# Patient Record
Sex: Female | Born: 1955 | Race: White | Hispanic: No | Marital: Married | Smoking: Never smoker
Health system: Southern US, Community
[De-identification: ages and names within clinical notes are randomized; demographics above are authoritative.]

## PROBLEM LIST (undated history)

## (undated) DIAGNOSIS — K219 Gastro-esophageal reflux disease without esophagitis: Secondary | ICD-10-CM

## (undated) DIAGNOSIS — I480 Paroxysmal atrial fibrillation: Secondary | ICD-10-CM

## (undated) DIAGNOSIS — E785 Hyperlipidemia, unspecified: Secondary | ICD-10-CM

## (undated) HISTORY — DX: Hyperlipidemia, unspecified: E78.5

## (undated) HISTORY — DX: Gastro-esophageal reflux disease without esophagitis: K21.9

---
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
## 1994-02-10 HISTORY — PX: DIAGNOSTIC LAPAROSCOPY WITH REMOVAL OF ECTOPIC PREGNANCY: SHX6449

## 1996-02-11 HISTORY — PX: CHOLECYSTECTOMY: SHX55

## 2013-02-10 HISTORY — PX: CARPAL TUNNEL RELEASE: SHX101

## 2014-12-29 LAB — HM COLONOSCOPY

## 2015-03-23 LAB — LIPID PANEL
Cholesterol: 226 — AB (ref 0–200)
HDL: 43 (ref 35–70)
LDL Cholesterol: 162
Triglycerides: 105 (ref 40–160)

## 2015-10-22 LAB — HEPATIC FUNCTION PANEL
ALT: 13 (ref 7–35)
AST: 20 (ref 13–35)
Alkaline Phosphatase: 86 (ref 25–125)
Bilirubin, Total: 0.8

## 2015-10-22 LAB — LIPID PANEL
Cholesterol: 216 — AB (ref 0–200)
HDL: 40 (ref 35–70)
LDL Cholesterol: 154
Triglycerides: 111 (ref 40–160)

## 2015-10-22 LAB — BASIC METABOLIC PANEL
BUN: 14 (ref 4–21)
Creatinine: 1.1 (ref 0.5–1.1)
Potassium: 4 (ref 3.4–5.3)
Sodium: 141 (ref 137–147)

## 2015-12-07 LAB — HM MAMMOGRAPHY

## 2018-09-21 ENCOUNTER — Ambulatory Visit: Payer: Self-pay | Admitting: Physician Assistant

## 2018-09-28 ENCOUNTER — Ambulatory Visit (INDEPENDENT_AMBULATORY_CARE_PROVIDER_SITE_OTHER): Payer: Commercial Managed Care - PPO | Admitting: Physician Assistant

## 2018-09-28 ENCOUNTER — Encounter: Payer: Self-pay | Admitting: Physician Assistant

## 2018-09-28 VITALS — Ht 65.0 in | Wt 175.0 lb

## 2018-09-28 DIAGNOSIS — I480 Paroxysmal atrial fibrillation: Secondary | ICD-10-CM | POA: Diagnosis not present

## 2018-09-28 DIAGNOSIS — R220 Localized swelling, mass and lump, head: Secondary | ICD-10-CM

## 2018-09-28 DIAGNOSIS — L719 Rosacea, unspecified: Secondary | ICD-10-CM | POA: Insufficient documentation

## 2018-09-28 DIAGNOSIS — E785 Hyperlipidemia, unspecified: Secondary | ICD-10-CM | POA: Insufficient documentation

## 2018-09-28 DIAGNOSIS — K219 Gastro-esophageal reflux disease without esophagitis: Secondary | ICD-10-CM | POA: Insufficient documentation

## 2018-09-28 DIAGNOSIS — R0602 Shortness of breath: Secondary | ICD-10-CM

## 2018-09-28 DIAGNOSIS — Z789 Other specified health status: Secondary | ICD-10-CM

## 2018-09-28 DIAGNOSIS — H9193 Unspecified hearing loss, bilateral: Secondary | ICD-10-CM | POA: Diagnosis not present

## 2018-09-28 HISTORY — DX: Paroxysmal atrial fibrillation: I48.0

## 2018-09-28 NOTE — Progress Notes (Signed)
Virtual Visit via Video   I connected with Kaitlin Cortez on 09/28/18 at  8:00 AM EDT by a video enabled telemedicine application and verified that I am speaking with the correct person using two identifiers. Location patient: Home Location provider: [facility], Office Persons participating in the virtual visit: Kaitlin Cortez, Jarold MottoSamantha Salvadore Valvano PA-C  I discussed the limitations of evaluation and management by telemedicine and the availability of in person appointments. The patient expressed understanding and agreed to proceed.  I acted as a Neurosurgeonscribe for Energy East CorporationSamantha Ziva Nunziata, PA-C Kimberly-ClarkDonna Orphanos, LPN  Subjective:   HPI:  Pt is establishing care today. Moved from [place], [place] last month. Husband got a job at [employer]. She works remotely doing Clinical biochemistcomputer programming.  Paroxysmal Atrial Fibrillation and new SOB Dx with A-fib in her mid 40's. Was trialed on several medications before ending up with flecainide and metoprolol. Stopped taking metoprolol about 10 years ago because it was making her HR too low. She also states that she is currently prescribed Flecainide 100 mg BID but she cannot tolerate taking it in the day so she only takes a 100 mg tablet at night. Has never had ablation because her symptoms were relatively well controlled.  Over the past few weeks, she states that she has had some intermittent SOB. Typically occurs with exertion, such as walking up stairs or going for a stroll. Last week when she went for a walk, her heart rate got up to 180's. She isn't sure if the change in weather is affecting her or if her a fib is becoming less controlled. She was seeing an EP in [place] and would like to see one here to discuss these issues. Denies b/l leg swelling, chest pain, fluid weight gain.  She does have hx of HLD. She has tried several statins, and all cause myalgias per her report. She takes daily ASA 325 mg.  Hearing Loss History of this. Currently wears hearing aids and her R  one is currently not functioning. Needs an audiologist.  Swelling of R side of face Has a small pocket of swelling on her R cheek that has been going on for the past few months. In the morning it is at its worst and decreases throughout the day. She denies trauma. She does use a daily antibacterial cream for her rosacea, not sure if this causing this.  ROS: See pertinent positives and negatives per HPI.  Patient Active Problem List   Diagnosis Date Noted  . Paroxysmal atrial fibrillation (HCC) 09/28/2018  . Bilateral hearing loss 09/28/2018  . Statin intolerance 09/28/2018  . Hyperlipidemia   . GERD (gastroesophageal reflux disease)   . Rosacea     Social History   Tobacco Use  . Smoking status: Never Smoker  . Smokeless tobacco: Never Used  Substance Use Topics  . Alcohol use: Yes    Alcohol/week: 1.0 standard drinks    Types: 1 Glasses of wine per week    Current Outpatient Medications:  .  aspirin 325 MG tablet, Take 325 mg by mouth daily., Disp: , Rfl:  .  flecainide (TAMBOCOR) 100 MG tablet, Take 100 mg by mouth at bedtime., Disp: , Rfl:  .  Multiple Vitamin (MULTIVITAMIN) tablet, Take 1 tablet by mouth daily., Disp: , Rfl:  .  pantoprazole (PROTONIX) 40 MG tablet, Take 40 mg by mouth at bedtime. , Disp: , Rfl:   Allergies  Allergen Reactions  . Statins Other (See Comments)    myalgias    Objective:  VITALS: Per patient if applicable, see vitals. GENERAL: Alert, appears well and in no acute distress. HEENT: Atraumatic, conjunctiva clear, no obvious abnormalities on inspection of external nose and ears. NECK: Normal movements of the head and neck. CARDIOPULMONARY: No increased WOB. Speaking in clear sentences. I:E ratio WNL.  MS: Moves all visible extremities without noticeable abnormality. PSYCH: Pleasant and cooperative, well-groomed. Speech normal rate and rhythm. Affect is appropriate. Insight and judgement are appropriate. Attention is focused, linear, and  appropriate.  NEURO: CN grossly intact. Oriented as arrived to appointment on time with no prompting. Moves both UE equally.  SKIN: No obvious lesions, wounds, erythema, or cyanosis noted on face or hands. *slight appearance of swelling to R maxillary/under eye area, no redness noted  Assessment and Plan:   Diagnoses and all orders for this visit:  Paroxysmal atrial fibrillation ([place]); SOB (shortness of breath) on exertion Will update labs. Referral to EP. Worsening precautions advised, instructed her to not delay care during COVID-19 if her symptoms change or worsen. -     Ambulatory referral to Cardiac Electrophysiology -     CBC -     TSH -     Comprehensive metabolic panel  Bilateral hearing loss, unspecified hearing loss type -     Ambulatory referral to Audiology  Swelling of right side of face Recommended she hold the cream she is using and see if that helps. If not, will likely send to dermatology.  Statin intolerance ASA per prior cardiologist recommendation.  . Reviewed expectations re: course of current medical issues. . Discussed self-management of symptoms. . Outlined signs and symptoms indicating need for more acute intervention. . Patient verbalized understanding and all questions were answered. Marland Kitchen Health Maintenance issues including appropriate healthy diet, exercise, and smoking avoidance were discussed with patient. . See orders for this visit as documented in the electronic medical record.  I discussed the assessment and treatment plan with the patient. The patient was provided an opportunity to ask questions and all were answered. The patient agreed with the plan and demonstrated an understanding of the instructions.   The patient was advised to call back or seek an in-person evaluation if the symptoms worsen or if the condition fails to improve as anticipated.   CMA or LPN served as scribe during this visit. History, Physical, and Plan performed by medical  provider. The above documentation has been reviewed and is accurate and complete.  I spent 45 minutes with this patient, greater than 50% was face-to-face time counseling regarding the above diagnoses.  [place], [place] 09/28/2018

## 2018-09-29 ENCOUNTER — Other Ambulatory Visit (INDEPENDENT_AMBULATORY_CARE_PROVIDER_SITE_OTHER): Payer: Commercial Managed Care - PPO

## 2018-09-29 ENCOUNTER — Other Ambulatory Visit: Payer: Self-pay | Admitting: Physician Assistant

## 2018-09-29 ENCOUNTER — Other Ambulatory Visit: Payer: Self-pay

## 2018-09-29 DIAGNOSIS — E785 Hyperlipidemia, unspecified: Secondary | ICD-10-CM

## 2018-09-29 DIAGNOSIS — R0602 Shortness of breath: Secondary | ICD-10-CM

## 2018-09-29 LAB — CBC
HCT: 41.6 % (ref 36.0–46.0)
Hemoglobin: 14 g/dL (ref 12.0–15.0)
MCHC: 33.7 g/dL (ref 30.0–36.0)
MCV: 92.6 fl (ref 78.0–100.0)
Platelets: 187 10*3/uL (ref 150.0–400.0)
RBC: 4.5 Mil/uL (ref 3.87–5.11)
RDW: 12.7 % (ref 11.5–15.5)
WBC: 4.4 10*3/uL (ref 4.0–10.5)

## 2018-09-29 LAB — COMPLETE METABOLIC PANEL WITH GFR
AG Ratio: 1.8 (calc) (ref 1.0–2.5)
ALT: 12 U/L (ref 6–29)
AST: 18 U/L (ref 10–35)
Albumin: 4.2 g/dL (ref 3.6–5.1)
Alkaline phosphatase (APISO): 76 U/L (ref 37–153)
BUN/Creatinine Ratio: 10 (calc) (ref 6–22)
BUN: 11 mg/dL (ref 7–25)
CO2: 24 mmol/L (ref 20–32)
Calcium: 9.8 mg/dL (ref 8.6–10.4)
Chloride: 104 mmol/L (ref 98–110)
Creat: 1.1 mg/dL — ABNORMAL HIGH (ref 0.50–0.99)
GFR, Est African American: 62 mL/min/{1.73_m2} (ref >=60)
GFR, Est Non African American: 54 mL/min/{1.73_m2} — ABNORMAL LOW (ref >=60)
Globulin: 2.3 g/dL (calc) (ref 1.9–3.7)
Glucose, Bld: 96 mg/dL (ref 65–99)
Potassium: 4.4 mmol/L (ref 3.5–5.3)
Sodium: 140 mmol/L (ref 135–146)
Total Bilirubin: 0.5 mg/dL (ref 0.2–1.2)
Total Protein: 6.5 g/dL (ref 6.1–8.1)

## 2018-09-29 LAB — LIPID PANEL
Cholesterol: 242 mg/dL — ABNORMAL HIGH (ref 0–200)
HDL: 44.6 mg/dL (ref >39.00)
LDL Cholesterol: 160 mg/dL — ABNORMAL HIGH (ref 0–99)
NonHDL: 197.14
Total CHOL/HDL Ratio: 5
Triglycerides: 187 mg/dL — ABNORMAL HIGH (ref 0.0–149.0)
VLDL: 37.4 mg/dL (ref 0.0–40.0)

## 2018-09-29 LAB — TSH: TSH: 2.6 u[IU]/mL (ref 0.35–4.50)

## 2018-09-29 NOTE — Addendum Note (Signed)
Addended by: Francis Dowse T on: 09/29/2018 08:30 AM   Modules accepted: Orders

## 2018-09-29 NOTE — Addendum Note (Signed)
Addended by: Francis Dowse T on: 09/29/2018 08:28 AM   Modules accepted: Orders

## 2018-09-29 NOTE — Addendum Note (Signed)
Addended by: Francis Dowse T on: 09/29/2018 08:26 AM   Modules accepted: Orders

## 2018-09-29 NOTE — Addendum Note (Signed)
Addended by: Erlene Quan on: 09/29/2018 08:24 AM   Modules accepted: Orders

## 2018-09-29 NOTE — Addendum Note (Signed)
Addended by: WARREN-COBBS, Norma Montemurro T on: 09/29/2018 08:26 AM   Modules accepted: Orders  

## 2018-10-04 ENCOUNTER — Other Ambulatory Visit: Payer: Self-pay | Admitting: Physician Assistant

## 2018-10-04 ENCOUNTER — Telehealth: Payer: Self-pay | Admitting: Physician Assistant

## 2018-10-04 DIAGNOSIS — R7989 Other specified abnormal findings of blood chemistry: Secondary | ICD-10-CM

## 2018-10-04 NOTE — Progress Notes (Signed)
Cardiology Office Note   Date:  10/05/2018   ID:  Kaitlin Cortez, DOB 1955/09/11, MRN [id number]  PCP:  Kaitlin MottoWorley, [name], PA  Cardiologist:   No primary care provider on file. Referring:  Kaitlin MottoWorley, [name], PA  Chief Complaint  Patient presents with  . Atrial Fibrillation      History of Present Illness: Kaitlin Cortez is a 63 y.o. female who is referred by Kaitlin MottoWorley, [name], PA to evaluate for atrial fib.  She has a history of this and just moved from [place].  She has been treated with Flecainide.  She said she has had atrial fibrillation for years.  She never really tolerated beta-blockers or apparently calcium channel blockers.  She gets a very low heart rate.  She feels poorly with this.  She has been on low-dose of flecainide and is only taken 100 mg once daily.  She says she has had occasional heart pounding and irregular beats which she knows to be her fibrillation.  This seems to happen at night.  It might last for about 30 minutes.  However, this is been a relatively stable pattern.  More recently she has had some other palpitations and she cannot tell exactly what this is.  She might be walking and her heart rate went up in the 180s which is much higher than her atrial fibrillation was.  It does not feel exactly like the fibrillation.  She has not had any presyncope or syncope.  She does not describe chest pressure, neck or arm discomfort.  She cannot bring her symptoms on.   Past Medical History:  Diagnosis Date  . GERD (gastroesophageal reflux disease)   . Hyperlipidemia   . Paroxysmal atrial fibrillation (HCC) 09/28/2018    Past Surgical History:  Procedure Laterality Date  . CARPAL TUNNEL RELEASE Bilateral 2015  . CHOLECYSTECTOMY  1998  . DIAGNOSTIC LAPAROSCOPY WITH REMOVAL OF ECTOPIC PREGNANCY Right 1996     Current Outpatient Medications  Medication Sig Dispense Refill  . aspirin 325 MG tablet Take 325 mg by mouth daily.    . flecainide (TAMBOCOR) 50 MG tablet  Take 1 tablet (50 mg total) by mouth 2 (two) times daily. 180 tablet 3  . Multiple Vitamin (MULTIVITAMIN) tablet Take 1 tablet by mouth daily.    . pantoprazole (PROTONIX) 40 MG tablet Take 40 mg by mouth at bedtime.      No current facility-administered medications for this visit.     Allergies:   Statins    Social History:  The patient  reports that she has never smoked. She has never used smokeless tobacco. She reports current alcohol use of about 1.0 standard drinks of alcohol per week. She reports that she does not use drugs.   Family History:  The patient's family history includes AAA (abdominal aortic aneurysm) in her mother; Dementia in her father.    ROS:  Please see the history of present illness.   Otherwise, review of systems are positive for none.   All other systems are reviewed and negative.    PHYSICAL EXAM: VS:  BP 102/60   Pulse (!) 50   Ht 5\' 5"  (1.651 m)   Wt 178 lb 6.4 oz (80.9 kg)   SpO2 98%   BMI 29.69 kg/m  , BMI Body mass index is 29.69 kg/m. GENERAL:  Well appearing HEENT:  Pupils equal round and reactive, fundi not visualized, oral mucosa unremarkable NECK:  No jugular venous distention, waveform within normal limits, carotid upstroke brisk and symmetric,  no bruits, no thyromegaly LYMPHATICS:  No cervical, inguinal adenopathy LUNGS:  Clear to auscultation bilaterally BACK:  No CVA tenderness CHEST:  Unremarkable HEART:  PMI not displaced or sustained,S1 and S2 within normal limits, no S3, no S4, no clicks, no rubs, no murmurs ABD:  Flat, positive bowel sounds normal in frequency in pitch, no bruits, no rebound, no guarding, no midline pulsatile mass, no hepatomegaly, no splenomegaly EXT:  2 plus pulses throughout, no edema, no cyanosis no clubbing SKIN:  No rashes no nodules NEURO:  Cranial nerves II through XII grossly intact, motor grossly intact throughout PSYCH:  Cognitively intact, oriented to person place and time    EKG:  EKG is ordered  today. The ekg ordered today demonstrates sinus rhythm, rate 52, axis within normal limits, intervals within normal limits, no acute ST-T wave changes.   Recent Labs: 09/29/2018: ALT 12; BUN 11; Creat 1.10; Hemoglobin 14.0; Platelets 187.0; Potassium 4.4; Sodium 140; TSH 2.60    Lipid Panel    Component Value Date/Time   CHOL 242 (H) 09/29/2018 0829   TRIG 187.0 (H) 09/29/2018 0829   HDL 44.60 09/29/2018 0829   CHOLHDL 5 09/29/2018 0829   VLDL 37.4 09/29/2018 0829   LDLCALC 160 (H) 09/29/2018 0829      Wt Readings from Last 3 Encounters:  10/05/18 178 lb 6.4 oz (80.9 kg)  09/28/18 175 lb (79.4 kg)      Other studies Reviewed: Additional studies/ records that were reviewed today include: None. Review of the above records demonstrates:  Please see elsewhere in the note.     ASSESSMENT AND PLAN:  PAF:    The patient does have paroxysms of fibrillation and might be having other arrhythmias.  It is hard to know at this point and so we discussed getting an Alive Cor.  At this point she does not think she is feeling flecainide.  I would like her to take it 50 mg twice a day.  She does not want to consider ablation which has been discussed with her in the past.  However, she would let me know if she has documented increased paroxysms of fibrillation or increased symptoms and we might want to discuss this.  We also discussed when she would crossover to have an indication for anticoagulation.  Currently [name] has a CHA2DS2 - VASc score of 1.    DYSLIPIDEMIA: She has not tolerated statins.  She does not want to take further medications.  She is a vegan.  She will continue with this and with increased exercise.    Current medicines are reviewed at length with the patient today.  The patient does not have concerns regarding medicines.  The following changes have been made:  no change  Labs/ tests ordered today include: None  Orders Placed This Encounter  Procedures  .  EKG 12-Lead     Disposition:   FU with me in one year.      Signed, Minus Breeding, MD  10/05/2018 4:29 PM    [facility]

## 2018-10-04 NOTE — Telephone Encounter (Signed)
Called pt to discuss Cardiology referral - lmom.

## 2018-10-05 ENCOUNTER — Other Ambulatory Visit: Payer: Self-pay

## 2018-10-05 ENCOUNTER — Other Ambulatory Visit (INDEPENDENT_AMBULATORY_CARE_PROVIDER_SITE_OTHER): Payer: Commercial Managed Care - PPO

## 2018-10-05 ENCOUNTER — Other Ambulatory Visit: Payer: Self-pay | Admitting: Physician Assistant

## 2018-10-05 ENCOUNTER — Encounter: Payer: Self-pay | Admitting: Cardiology

## 2018-10-05 ENCOUNTER — Ambulatory Visit (INDEPENDENT_AMBULATORY_CARE_PROVIDER_SITE_OTHER): Payer: Commercial Managed Care - PPO | Admitting: Cardiology

## 2018-10-05 VITALS — BP 102/60 | HR 50 | Ht 65.0 in | Wt 178.4 lb

## 2018-10-05 DIAGNOSIS — E785 Hyperlipidemia, unspecified: Secondary | ICD-10-CM

## 2018-10-05 DIAGNOSIS — R7989 Other specified abnormal findings of blood chemistry: Secondary | ICD-10-CM

## 2018-10-05 DIAGNOSIS — I480 Paroxysmal atrial fibrillation: Secondary | ICD-10-CM | POA: Diagnosis not present

## 2018-10-05 MED ORDER — FLECAINIDE ACETATE 50 MG PO TABS: 50.0000 mg | ORAL_TABLET | Freq: Two times a day (BID) | ORAL | 3 refills | Status: DC

## 2018-10-05 NOTE — Patient Instructions (Addendum)
Medication Instructions:  CHANGE YOUR FLECAINIDE TO 50 MG TWICE A DAY  If you need a refill on your cardiac medications before your next appointment, please call your pharmacy.   Lab work: NONE  Testing/Procedures: NONE  Follow-Up: At [facility], you and your health needs are our priority.  As part of our continuing mission to provide you with exceptional heart care, we have created designated Provider Care Teams.  These Care Teams include your primary Cardiologist (physician) and Advanced Practice Providers (APPs -  Physician Assistants and Nurse Practitioners) who all work together to provide you with the care you need, when you need it. You will need a follow up appointment in 12 months.  Please call our office 2 months in advance to schedule this appointment.  You may see DR [facility]  or one of the following Advanced Practice Providers on your designated Care Team:   Rosaria Ferries, PA-C . Jory Sims, DNP, ANP  Any Other Special Instructions Will Be Listed Below (If Applicable).  [facility]

## 2018-10-06 LAB — BASIC METABOLIC PANEL WITH GFR
BUN/Creatinine Ratio: 11 (calc) (ref 6–22)
BUN: 12 mg/dL (ref 7–25)
CO2: 27 mmol/L (ref 20–32)
Calcium: 9.9 mg/dL (ref 8.6–10.4)
Chloride: 104 mmol/L (ref 98–110)
Creat: 1.12 mg/dL — ABNORMAL HIGH (ref 0.50–0.99)
GFR, Est African American: 61 mL/min/{1.73_m2} (ref >=60)
GFR, Est Non African American: 53 mL/min/{1.73_m2} — ABNORMAL LOW (ref >=60)
Glucose, Bld: 87 mg/dL (ref 65–99)
Potassium: 4.3 mmol/L (ref 3.5–5.3)
Sodium: 142 mmol/L (ref 135–146)

## 2018-10-13 ENCOUNTER — Encounter: Payer: Self-pay | Admitting: Physician Assistant

## 2018-10-13 LAB — TSH: TSH: 3.57

## 2018-10-13 LAB — ESTIMATED GFR: EGFR (Non-African Amer.): 49

## 2018-10-19 ENCOUNTER — Encounter: Payer: Self-pay | Admitting: Physician Assistant

## 2018-11-08 ENCOUNTER — Other Ambulatory Visit: Payer: Self-pay

## 2018-11-08 ENCOUNTER — Encounter: Payer: Self-pay | Admitting: Physician Assistant

## 2018-11-08 ENCOUNTER — Ambulatory Visit (INDEPENDENT_AMBULATORY_CARE_PROVIDER_SITE_OTHER): Payer: Commercial Managed Care - PPO | Admitting: Physician Assistant

## 2018-11-08 VITALS — BP 120/80 | HR 53 | Temp 98.2°F | Ht 65.0 in | Wt 182.2 lb

## 2018-11-08 DIAGNOSIS — R350 Frequency of micturition: Secondary | ICD-10-CM | POA: Diagnosis not present

## 2018-11-08 DIAGNOSIS — R1031 Right lower quadrant pain: Secondary | ICD-10-CM | POA: Diagnosis not present

## 2018-11-08 LAB — COMPREHENSIVE METABOLIC PANEL
ALT: 11 U/L (ref 0–35)
AST: 18 U/L (ref 0–37)
Albumin: 4.4 g/dL (ref 3.5–5.2)
Alkaline Phosphatase: 83 U/L (ref 39–117)
BUN: 11 mg/dL (ref 6–23)
CO2: 31 mEq/L (ref 19–32)
Calcium: 10 mg/dL (ref 8.4–10.5)
Chloride: 101 mEq/L (ref 96–112)
Creatinine, Ser: 1.11 mg/dL (ref 0.40–1.20)
GFR: 49.71 mL/min — ABNORMAL LOW (ref >60.00)
Glucose, Bld: 71 mg/dL (ref 70–99)
Potassium: 4.3 mEq/L (ref 3.5–5.1)
Sodium: 141 mEq/L (ref 135–145)
Total Bilirubin: 0.5 mg/dL (ref 0.2–1.2)
Total Protein: 6.8 g/dL (ref 6.0–8.3)

## 2018-11-08 LAB — CBC WITH DIFFERENTIAL/PLATELET
Basophils Absolute: 0.1 10*3/uL (ref 0.0–0.1)
Basophils Relative: 0.8 % (ref 0.0–3.0)
Eosinophils Absolute: 0.1 10*3/uL (ref 0.0–0.7)
Eosinophils Relative: 0.9 % (ref 0.0–5.0)
HCT: 41.9 % (ref 36.0–46.0)
Hemoglobin: 14 g/dL (ref 12.0–15.0)
Lymphocytes Relative: 37 % (ref 12.0–46.0)
Lymphs Abs: 2.4 10*3/uL (ref 0.7–4.0)
MCHC: 33.3 g/dL (ref 30.0–36.0)
MCV: 92.8 fl (ref 78.0–100.0)
Monocytes Absolute: 0.5 10*3/uL (ref 0.1–1.0)
Monocytes Relative: 7.8 % (ref 3.0–12.0)
Neutro Abs: 3.5 10*3/uL (ref 1.4–7.7)
Neutrophils Relative %: 53.5 % (ref 43.0–77.0)
Platelets: 204 10*3/uL (ref 150.0–400.0)
RBC: 4.52 Mil/uL (ref 3.87–5.11)
RDW: 12.7 % (ref 11.5–15.5)
WBC: 6.5 10*3/uL (ref 4.0–10.5)

## 2018-11-08 LAB — POCT URINALYSIS DIPSTICK
Bilirubin, UA: NEGATIVE
Blood, UA: NEGATIVE
Glucose, UA: NEGATIVE
Ketones, UA: NEGATIVE
Leukocytes, UA: NEGATIVE
Nitrite, UA: NEGATIVE
Protein, UA: NEGATIVE
Spec Grav, UA: <=1.005 — AB (ref 1.010–1.025)
Urobilinogen, UA: 0.2 E.U./dL
pH, UA: 7.5 (ref 5.0–8.0)

## 2018-11-08 LAB — LIPASE: Lipase: 13 U/L (ref 11.0–59.0)

## 2018-11-08 MED ORDER — VALACYCLOVIR HCL 1 G PO TABS: 1000.0000 mg | ORAL_TABLET | Freq: Three times a day (TID) | ORAL | 0 refills | Status: DC

## 2018-11-08 NOTE — Patient Instructions (Signed)
It was great to see you!  We will contact you when your labs return, either today or tomorrow, and also check in to see how you are feeling. We may need to get imaging of some sort if you do not have improvement of symptoms.    Many things can cause belly (abdominal) pain. Most times, belly pain is not dangerous. Many cases of belly pain can be watched and treated at home. Sometimes belly pain is serious, though. Your doctor will try to find the cause of your belly pain.   Follow these instructions at home:  Take over-the-counter and prescription medicines only as told by your doctor. Do not take medicines that help you poop (laxatives) unless told to by your doctor.  Drink enough fluid to keep your pee (urine) clear or pale yellow.  Watch your belly pain for any changes.  Keep all follow-up visits as told by your doctor. This is important.  Contact a doctor if:  Your belly pain changes or gets worse.  You are not hungry, or you lose weight without trying.  You are having trouble pooping (constipated) or have watery poop (diarrhea) for more than 2-3 days.  You have pain when you pee or poop.  Your belly pain wakes you up at night.  Your pain gets worse with meals, after eating, or with certain foods.  You are throwing up and cannot keep anything down.  You have a fever.  Get help right away if:  Your pain does not go away as soon as your doctor says it should.  You cannot stop throwing up.  Your pain is only in areas of your belly, such as the right side or the left lower part of the belly.  You have bloody or black poop, or poop that looks like tar.  You have very bad pain, cramping, or bloating in your belly.  You have signs of not having enough fluid or water in your body (dehydration), such as: ? Dark pee, very little pee, or no pee. ? Cracked lips. ? Dry mouth. ? Sunken eyes. ? Sleepiness. ? Weakness. ?   Take care,  Inda Coke PA-C

## 2018-11-08 NOTE — Progress Notes (Signed)
Kaitlin Cortez is a 63 y.o. female here for a new problem.  I acted as a Education administrator for [employer], PA-C Anselmo Pickler, LPN  History of Present Illness:   Chief Complaint  Patient presents with  . Back Pain  . Urinary Frequency    HPI   Back pain Pt c/o back pain moderate to severe at times the past several weeks. Pt having urinary frequency also and is concerned she might have a UTI. Urinary frequency has been for several years -- feels like she is good about avoiding caffeine and that she is emptying her bladder all of the way. Today pain is radiating from back to RLQ.  She has history of recent slight decrease in GFR of 53-54, but otherwise no history of back pain or kidney issues. She is eating and drinking well. Just "feels off." Denies nausea, vomiting, severe abdominal pain, fever, rectal bleeding, c/d. She is vegan and does not have any issues with constipation. She sits in an office chair all day at home, but does feel like she has good ergonomics with lumbar support.   Past Medical History:  Diagnosis Date  . GERD (gastroesophageal reflux disease)   . Hyperlipidemia   . Paroxysmal atrial fibrillation ([place]) 09/28/2018     Social History   Socioeconomic History  . Marital status: Married    Spouse name: Not on file  . Number of children: Not on file  . Years of education: Not on file  . Highest education level: Not on file  Occupational History  . Not on file  Social Needs  . Financial resource strain: Not on file  . Food insecurity    Worry: Not on file    Inability: Not on file  . Transportation needs    Medical: Not on file    Non-medical: Not on file  Tobacco Use  . Smoking status: Never Smoker  . Smokeless tobacco: Never Used  Substance and Sexual Activity  . Alcohol use: Yes    Alcohol/week: 1.0 standard drinks    Types: 1 Glasses of wine per week  . Drug use: Never  . Sexual activity: Yes    Birth control/protection: None  Lifestyle  . Physical  activity    Days per week: Not on file    Minutes per session: Not on file  . Stress: Not on file  Relationships  . Social Herbalist on phone: Not on file    Gets together: Not on file    Attends religious service: Not on file    Active member of club or organization: Not on file    Attends meetings of clubs or organizations: Not on file    Relationship status: Not on file  . Intimate partner violence    Fear of current or ex partner: Not on file    Emotionally abused: Not on file    Physically abused: Not on file    Forced sexual activity: Not on file  Other Topics Concern  . Not on file  Social History Narrative   Moved from [place].      Past Surgical History:  Procedure Laterality Date  . CARPAL TUNNEL RELEASE Bilateral 2015  . CHOLECYSTECTOMY  1998  . DIAGNOSTIC LAPAROSCOPY WITH REMOVAL OF ECTOPIC PREGNANCY Right 1996    Family History  Problem Relation Age of Onset  . AAA (abdominal aortic aneurysm) Mother   . Dementia Father     Allergies  Allergen Reactions  . Statins Other (See  Comments)    myalgias    Current Medications:   Current Outpatient Medications:  .  aspirin 325 MG tablet, Take 325 mg by mouth daily., Disp: , Rfl:  .  flecainide (TAMBOCOR) 50 MG tablet, Take 1 tablet (50 mg total) by mouth 2 (two) times daily., Disp: 180 tablet, Rfl: 3 .  Multiple Vitamin (MULTIVITAMIN) tablet, Take 1 tablet by mouth daily., Disp: , Rfl:  .  pantoprazole (PROTONIX) 40 MG tablet, Take 40 mg by mouth at bedtime. , Disp: , Rfl:  .  valACYclovir (VALTREX) 1000 MG tablet, Take 1 tablet (1,000 mg total) by mouth 3 (three) times daily for 7 days., Disp: 21 tablet, Rfl: 0   Review of Systems:   ROS  Negative unless otherwise specified per HPI.   Vitals:   Vitals:   11/08/18 1148  BP: 120/80  Pulse: (!) 53  Temp: 98.2 F (36.8 C)  TempSrc: Temporal  SpO2: 98%  Weight: 182 lb 4 oz (82.7 kg)  Height: 5\' 5"  (1.651 m)     Body mass index is 30.33  kg/m.  Physical Exam:   Physical Exam Vitals signs and nursing note reviewed.  Constitutional:      General: She is not in acute distress.    Appearance: She is well-developed. She is not ill-appearing or toxic-appearing.  Cardiovascular:     Rate and Rhythm: Regular rhythm. Bradycardia present.     Pulses: Normal pulses.     Heart sounds: Normal heart sounds, S1 normal and S2 normal.     Comments: No LE edema Pulmonary:     Effort: Pulmonary effort is normal.     Breath sounds: Normal breath sounds.  Abdominal:     General: Abdomen is flat. Bowel sounds are normal.     Palpations: Abdomen is soft.     Tenderness: There is abdominal tenderness in the right lower quadrant. There is no right CVA tenderness, left CVA tenderness, guarding or rebound. Negative signs include McBurney's sign.  Musculoskeletal:     Comments: No decreased ROM 2/2 pain with flexion/extension, lateral side bends, or rotation. Reproducible tenderness with deep palpation to bilateral paraspinal muscles. No bony tenderness.   Skin:    General: Skin is warm and dry.  Neurological:     Mental Status: She is alert.     GCS: GCS eye subscore is 4. GCS verbal subscore is 5. GCS motor subscore is 6.  Psychiatric:        Speech: Speech normal.        Behavior: Behavior normal. Behavior is cooperative.    Results for orders placed or performed in visit on 11/08/18  POCT urinalysis dipstick  Result Value Ref Range   Color, UA Yellow    Clarity, UA Clear    Glucose, UA Negative Negative   Bilirubin, UA Negative    Ketones, UA Negative    Spec Grav, UA <=1.005 (A) 1.010 - 1.025   Blood, UA Negative    pH, UA 7.5 5.0 - 8.0   Protein, UA Negative Negative   Urobilinogen, UA 0.2 0.2 or 1.0 E.U./dL   Nitrite, UA Negative    Leukocytes, UA Negative Negative   Appearance     Odor       Assessment and Plan:   Kaitlin Cortez was seen today for back pain and urinary frequency.  Diagnoses and all orders for this  visit:  Urinary frequency; RLQ abdominal pain Unclear etiology. Suspect possible muscle strain. No signs of acute abdomen on exam.  Urine culture pending, also ordered CMP, CBC, lipase. Worsening precautions advised in the interim. We did discuss possible imaging if symptoms progress or persist. She verbalized understanding to plan.  -     POCT urinalysis dipstick -     Urine Culture -     CBC with Differential/Platelet -     Comprehensive metabolic panel -     Lipase  . Reviewed expectations re: course of current medical issues. . Discussed self-management of symptoms. . Outlined signs and symptoms indicating need for more acute intervention. . Patient verbalized understanding and all questions were answered. . See orders for this visit as documented in the electronic medical record. . Patient received an After-Visit Summary.  CMA or LPN served as scribe during this visit. History, Physical, and Plan performed by medical provider. The above documentation has been reviewed and is accurate and complete.  Jarold Motto, PA-C

## 2018-11-08 NOTE — Telephone Encounter (Signed)
Please call pt and schedule an in office appointment  for possible UTI.

## 2018-11-10 LAB — URINE CULTURE
MICRO NUMBER:: 928953
SPECIMEN QUALITY:: ADEQUATE

## 2018-12-03 ENCOUNTER — Ambulatory Visit (HOSPITAL_COMMUNITY)
Admission: RE | Admit: 2018-12-03 | Discharge: 2018-12-03 | Disposition: A | Discharge status: Home / Self Care | Payer: Commercial Managed Care - PPO | Source: Ambulatory Visit | Attending: Physician Assistant | Admitting: Physician Assistant

## 2018-12-03 ENCOUNTER — Other Ambulatory Visit: Payer: Self-pay

## 2018-12-03 ENCOUNTER — Ambulatory Visit (INDEPENDENT_AMBULATORY_CARE_PROVIDER_SITE_OTHER): Payer: Commercial Managed Care - PPO | Admitting: Physician Assistant

## 2018-12-03 ENCOUNTER — Other Ambulatory Visit: Payer: Self-pay | Admitting: *Deleted

## 2018-12-03 ENCOUNTER — Encounter: Payer: Self-pay | Admitting: Physician Assistant

## 2018-12-03 ENCOUNTER — Other Ambulatory Visit: Payer: Self-pay | Admitting: Physician Assistant

## 2018-12-03 VITALS — BP 130/76 | HR 69 | Temp 98.5°F | Ht 65.0 in | Wt 184.0 lb

## 2018-12-03 DIAGNOSIS — R1032 Left lower quadrant pain: Secondary | ICD-10-CM | POA: Diagnosis not present

## 2018-12-03 DIAGNOSIS — R300 Dysuria: Secondary | ICD-10-CM | POA: Diagnosis not present

## 2018-12-03 LAB — POCT URINALYSIS DIPSTICK
Bilirubin, UA: NEGATIVE
Blood, UA: 2
Glucose, UA: NEGATIVE
Ketones, UA: NEGATIVE
Nitrite, UA: POSITIVE
Protein, UA: POSITIVE — AB
Spec Grav, UA: 1.015 (ref 1.010–1.025)
Urobilinogen, UA: 0.2 E.U./dL
pH, UA: 7.5 (ref 5.0–8.0)

## 2018-12-03 MED ORDER — CIPROFLOXACIN HCL 250 MG PO TABS: 250.0000 mg | ORAL_TABLET | Freq: Two times a day (BID) | ORAL | 0 refills | Status: DC

## 2018-12-03 MED ORDER — AMOXICILLIN-POT CLAVULANATE 875-125 MG PO TABS: 1.0000 | ORAL_TABLET | Freq: Two times a day (BID) | ORAL | 0 refills | Status: DC

## 2018-12-03 NOTE — Progress Notes (Signed)
Kaitlin Cortez is a 63 y.o. female here for a new problem.  I acted as a Neurosurgeon for [employer], PA-C Corky Mull, LPN  History of Present Illness:   Chief Complaint  Patient presents with  . Urinary symptoms    HPI   Urinary symptoms/LLQ pain Pt c/o frequency and pain with urination started middle of night. Also having low back pain and pain LLQ. Nausea this AM. No diarrhea, but does endorse recent constipation over the past few days which is very unusual for her.  Denies: rectal bleeding, recent changes in dietary intake, vomiting, gross hematuria  States that her prior colonoscopy was normal per her recollection. Denies any history of diverticulosis or diverticulitis. Follows strict vegan diet.  She saw me for somewhat similar symptoms last month, was treated for UTI. Had RLQ pain at that time, which resolved. CBC and CMP were obtained -- which were essentially normal except for GFR of 49.   Past Medical History:  Diagnosis Date  . GERD (gastroesophageal reflux disease)   . Hyperlipidemia   . Paroxysmal atrial fibrillation (HCC) 09/28/2018     Social History   Socioeconomic History  . Marital status: Married    Spouse name: Not on file  . Number of children: Not on file  . Years of education: Not on file  . Highest education level: Not on file  Occupational History  . Not on file  Social Needs  . Financial resource strain: Not on file  . Food insecurity    Worry: Not on file    Inability: Not on file  . Transportation needs    Medical: Not on file    Non-medical: Not on file  Tobacco Use  . Smoking status: Never Smoker  . Smokeless tobacco: Never Used  Substance and Sexual Activity  . Alcohol use: Yes    Alcohol/week: 1.0 standard drinks    Types: 1 Glasses of wine per week  . Drug use: Never  . Sexual activity: Yes    Birth control/protection: None  Lifestyle  . Physical activity    Days per week: Not on file    Minutes per session: Not on file   . Stress: Not on file  Relationships  . Social Musician on phone: Not on file    Gets together: Not on file    Attends religious service: Not on file    Active member of club or organization: Not on file    Attends meetings of clubs or organizations: Not on file    Relationship status: Not on file  . Intimate partner violence    Fear of current or ex partner: Not on file    Emotionally abused: Not on file    Physically abused: Not on file    Forced sexual activity: Not on file  Other Topics Concern  . Not on file  Social History Narrative   Moved from [place].      Past Surgical History:  Procedure Laterality Date  . CARPAL TUNNEL RELEASE Bilateral 2015  . CHOLECYSTECTOMY  1998  . DIAGNOSTIC LAPAROSCOPY WITH REMOVAL OF ECTOPIC PREGNANCY Right 1996    Family History  Problem Relation Age of Onset  . AAA (abdominal aortic aneurysm) Mother   . Dementia Father     Allergies  Allergen Reactions  . Statins Other (See Comments)    myalgias    Current Medications:   Current Outpatient Medications:  .  aspirin 325 MG tablet, Take 325 mg by  mouth daily., Disp: , Rfl:  .  flecainide (TAMBOCOR) 50 MG tablet, Take 1 tablet (50 mg total) by mouth 2 (two) times daily., Disp: 180 tablet, Rfl: 3 .  Multiple Vitamin (MULTIVITAMIN) tablet, Take 1 tablet by mouth daily., Disp: , Rfl:  .  pantoprazole (PROTONIX) 40 MG tablet, Take 40 mg by mouth at bedtime. , Disp: , Rfl:    Review of Systems:   ROS  Negative unless otherwise specified per HPI.   Vitals:   Vitals:   12/03/18 1012  BP: 130/76  Pulse: 69  Temp: 98.5 F (36.9 C)  TempSrc: Temporal  SpO2: 98%  Weight: 184 lb (83.5 kg)  Height: 5\' 5"  (1.651 m)     Body mass index is 30.62 kg/m.  Physical Exam:   Physical Exam Vitals signs and nursing note reviewed.  Constitutional:      General: She is not in acute distress.    Appearance: She is well-developed. She is not ill-appearing or  toxic-appearing.  Cardiovascular:     Rate and Rhythm: Normal rate and regular rhythm.     Pulses: Normal pulses.     Heart sounds: Normal heart sounds, S1 normal and S2 normal.     Comments: No LE edema Pulmonary:     Effort: Pulmonary effort is normal.     Breath sounds: Normal breath sounds.  Abdominal:     General: Abdomen is flat. Bowel sounds are normal.     Palpations: Abdomen is soft.     Tenderness: There is abdominal tenderness in the left lower quadrant. There is no right CVA tenderness or left CVA tenderness.  Skin:    General: Skin is warm and dry.  Neurological:     Mental Status: She is alert.     GCS: GCS eye subscore is 4. GCS verbal subscore is 5. GCS motor subscore is 6.  Psychiatric:        Speech: Speech normal.        Behavior: Behavior normal. Behavior is cooperative.     Results for orders placed or performed in visit on 12/03/18  POCT urinalysis dipstick  Result Value Ref Range   Color, UA dark amber    Clarity, UA cloudy    Glucose, UA Negative Negative   Bilirubin, UA Negative    Ketones, UA Negative    Spec Grav, UA 1.015 1.010 - 1.025   Blood, UA 2    pH, UA 7.5 5.0 - 8.0   Protein, UA Positive (A) Negative   Urobilinogen, UA 0.2 0.2 or 1.0 E.U./dL   Nitrite, UA Positive    Leukocytes, UA Moderate (2+) (A) Negative   Appearance     Odor      Assessment and Plan:   Kaitlin Cortez was seen today for urinary symptoms.  Diagnoses and all orders for this visit:  Dysuria; Left lower quadrant abdominal pain UA is concerning for acute cystitis but I cannot rule out renal stone, diverticulitis or other source of pain. Due to no prior history of diverticulosis and concern for possible stone, will obtain CT of abd/pelvis at this time. We are going to do without IV contrast due to renal insufficiency. Further intervention based on results. Likely will choose augmentin if concern for any GI infection. -     POCT urinalysis dipstick -     Urine Culture -      CT Abdomen Pelvis Wo Contrast; Future  . Reviewed expectations re: course of current medical issues. . Discussed self-management of  symptoms. . Outlined signs and symptoms indicating need for more acute intervention. . Patient verbalized understanding and all questions were answered. . See orders for this visit as documented in the electronic medical record. . Patient received an After-Visit Summary.  CMA or LPN served as scribe during this visit. History, Physical, and Plan performed by medical provider. The above documentation has been reviewed and is accurate and complete.   Inda Coke, PA-C

## 2018-12-03 NOTE — Patient Instructions (Signed)
It was great to see you!  We will be in touch with your results and the plan.  If in the meantime you have any worsening symptoms, please go to the ER instead.  Take care,  Inda Coke PA-C    Many things can cause belly (abdominal) pain. Most times, belly pain is not dangerous. Many cases of belly pain can be watched and treated at home. Sometimes belly pain is serious, though. Your doctor will try to find the cause of your belly pain.   Follow these instructions at home:  Take over-the-counter and prescription medicines only as told by your doctor. Do not take medicines that help you poop (laxatives) unless told to by your doctor.  Drink enough fluid to keep your pee (urine) clear or pale yellow.  Watch your belly pain for any changes.  Keep all follow-up visits as told by your doctor. This is important.  Contact a doctor if:  Your belly pain changes or gets worse.  You are not hungry, or you lose weight without trying.  You are having trouble pooping (constipated) or have watery poop (diarrhea) for more than 2-3 days.  You have pain when you pee or poop.  Your belly pain wakes you up at night.  Your pain gets worse with meals, after eating, or with certain foods.  You are throwing up and cannot keep anything down.  You have a fever.  Get help right away if:  Your pain does not go away as soon as your doctor says it should.  You cannot stop throwing up.  Your pain is only in areas of your belly, such as the right side or the left lower part of the belly.  You have bloody or black poop, or poop that looks like tar.  You have very bad pain, cramping, or bloating in your belly.  You have signs of not having enough fluid or water in your body (dehydration), such as: ? Dark pee, very little pee, or no pee. ? Cracked lips. ? Dry mouth. ? Sunken eyes. ? Sleepiness. ? Weakness. ?

## 2018-12-06 LAB — UNLABELED: Test Ordered On Req: 395

## 2018-12-09 ENCOUNTER — Encounter: Payer: Self-pay | Admitting: Physician Assistant

## 2018-12-09 LAB — URINE CULTURE
MICRO NUMBER:: 1039823
SPECIMEN QUALITY:: ADEQUATE

## 2018-12-09 LAB — PAT ID TIQ DOC: Test Affected: 395

## 2019-01-18 ENCOUNTER — Ambulatory Visit (HOSPITAL_COMMUNITY)
Admission: RE | Admit: 2019-01-18 | Discharge: 2019-01-18 | Disposition: A | Discharge status: Home / Self Care | Payer: Commercial Managed Care - PPO | Source: Ambulatory Visit | Attending: Nurse Practitioner | Admitting: Nurse Practitioner

## 2019-01-18 DIAGNOSIS — I480 Paroxysmal atrial fibrillation: Secondary | ICD-10-CM | POA: Diagnosis not present

## 2019-01-18 MED ORDER — DILTIAZEM HCL 30 MG PO TABS: ORAL_TABLET | ORAL | 2 refills | Status: DC

## 2019-01-19 NOTE — Progress Notes (Signed)
Electrophysiology TeleHealth Note   Due to national recommendations of social distancing due to COVID 19, video telehealth visit is felt to be most appropriate for this patient at this time.  See MyChart message/consent below  from today for patient consent regarding telehealth for the [facility].    Date:  01/19/2019   ID:  Kaitlin Cortez, DOB October 11, 1955, MRN [id number]  Location: home  Provider location: [address] [place], [place] [address] Evaluation Performed: New patient consult   PCP:  [name], [name], PA  Primary Cardiologist: Dr. Antoine PocheHochrein Primary Electrophysiologist: None    CC: I had two recent afib epiosdes   History of Present Illness: Kaitlin Cortez is a 63 y.o. female who presents via video conferencing for a telehealth visit today.   The patient is referred for new consultation regarding  paroxysmal afib  by Dr Antoine PocheHochrein. PT had 2 recent episodes that is out of the norm for her with fast ventricular rates. She is on flecainide 50 mg bid but not any rate control to oppose flecainide 2/2 to slow HR's/hypotension in the [place] area where she recently moved from. The worst episode lasted 4 hours. No known trigger.  She signed release for medical records in August but I cannot find in  [place]. Pt is not on anticoagulation for a CHA2DS2VASc score of 1(sex). Takes a 325 mg asa daily.  Today, she denies symptoms of palpitations, chest pain, shortness of breath, orthopnea, PND, lower extremity edema, claudication, dizziness, presyncope, syncope, bleeding, or neurologic sequela. The patient is tolerating medications without difficulties and is otherwise without complaint today.   she denies symptoms of cough, fevers, chills, or new SOB worrisome for COVID 19.     Atrial Fibrillation Risk Factors:  she does not have symptoms or diagnosis of sleep apnea. she does not have a history of rheumatic fever. she does not have a history of alcohol use.   she has a  BMI of There is no height or weight on file to calculate BMI.. There were no vitals filed for this visit.  Past Medical History:  Diagnosis Date  . GERD (gastroesophageal reflux disease)   . Hyperlipidemia   . Paroxysmal atrial fibrillation (HCC) 09/28/2018   Past Surgical History:  Procedure Laterality Date  . CARPAL TUNNEL RELEASE Bilateral 2015  . CHOLECYSTECTOMY  1998  . DIAGNOSTIC LAPAROSCOPY WITH REMOVAL OF ECTOPIC PREGNANCY Right 1996     Current Outpatient Medications  Medication Sig Dispense Refill  . amoxicillin-clavulanate (AUGMENTIN) 875-125 MG tablet Take 1 tablet by mouth 2 (two) times daily. 20 tablet 0  . aspirin 325 MG tablet Take 325 mg by mouth daily.    Marland Kitchen. diltiazem (CARDIZEM) 30 MG tablet Take 1 Tablet Every 4 Hours As Needed For HR >100 45 tablet 2  . flecainide (TAMBOCOR) 50 MG tablet Take 1 tablet (50 mg total) by mouth 2 (two) times daily. 180 tablet 3  . Multiple Vitamin (MULTIVITAMIN) tablet Take 1 tablet by mouth daily.    . pantoprazole (PROTONIX) 40 MG tablet Take 40 mg by mouth at bedtime.      No current facility-administered medications for this encounter.     Allergies:   Statins   Social History:  The patient  reports that she has never smoked. She has never used smokeless tobacco. She reports current alcohol use of about 1.0 standard drinks of alcohol per week. She reports that she does not use drugs.   Family History:  The patient's  family  history includes AAA (abdominal aortic aneurysm) in her mother; Dementia in her father.    ROS:  Please see the history of present illness.   All other systems are personally reviewed and negative.   Exam: Well appearing, alert and conversant, regular work of breathing,  good skin color   Recent Labs: 09/29/2018: TSH 2.60 11/08/2018: ALT 11; BUN 11; Creatinine, Ser 1.11; Hemoglobin 14.0; Platelets 204.0; Potassium 4.3; Sodium 141  personally reviewed    Other studies personally reviewed: Epic records  reviewed   ASSESSMENT AND PLAN:  1. Paroxysmal atrial fibrillation Recent increase in episodes Options discussed with pt  I do not feel comfortable with increase in flecainide unless rate control on board Pt does not think she can tolerate for the brady/hypotension Ablation discussed which she would want to defer now with covid Change  to Multaq also discussed  Pt would like a conservative approach, so for now will rx diltaizem 30 mg   to use as needed for afib episodes  Pt will continue to monitor with her watch    2. CHA2DS2VAScThis patients CHA2DS2-VASc Score and unadjusted Ischemic Stroke Rate (% per year) is equal to 0.6 % stroke rate/year from a score of 1  Pt is taking  325 mg asa daily  Above score calculated as 1 point each if present [CHF, HTN, DM, Vascular=MI/PAD/Aortic Plaque, Age if 65-74, or Female] Above score calculated as 2 points each if present [Age > 75, or Stroke/TIA/TE]  Follow-up:  In 2 weeks with virtual visit   Current medicines are reviewed at length with the patient today.   The patient does not have concerns regarding her medicines.  The following changes were made today:  none  Labs/ tests ordered today include: none No orders of the defined types were placed in this encounter.   Patient Risk:  after full review of this patients clinical status, I feel that they are at mod  risk at this time.   Today, I have spent15 minutes with the patient with telehealth technology discussing afib management  .    Eduard Roux NP  01/19/2019 8:35 AM  [facility] [address] [place], [place] [address] [phone]   I hereby voluntarily request, consent and authorize the [facility] and its employed or contracted physicians, physician assistants, nurse practitioners or other licensed health care professionals (the Practitioner), to provide me with telemedicine health care services (the "Services") as deemed  necessary by the treating Practitioner. I acknowledge and consent to receive the Services by the Practitioner via telemedicine. I understand that the telemedicine visit will involve communicating with the Practitioner through live audiovisual communication technology and the disclosure of certain medical information by electronic transmission. I acknowledge that I have been given the opportunity to request an in-person assessment or other available alternative prior to the telemedicine visit and am voluntarily participating in the telemedicine visit.   I understand that I have the right to withhold or withdraw my consent to the use of telemedicine in the course of my care at any time, without affecting my right to future care or treatment, and that the Practitioner or I may terminate the telemedicine visit at any time. I understand that I have the right to inspect all information obtained and/or recorded in the course of the telemedicine visit and may receive copies of available information for a reasonable fee.  I understand that some of the potential risks of receiving the Services via telemedicine include:  Delay or interruption in medical evaluation due to technological equipment failure or disruption;  Information transmitted may not be sufficient (e.g. poor resolution of images) to allow for appropriate medical decision making by the Practitioner; and/or  In rare instances, security protocols could fail, causing a breach of personal health information.   Furthermore, I acknowledge that it is my responsibility to provide information about my medical history, conditions and care that is complete and accurate to the best of my ability. I acknowledge that Practitioner's advice, recommendations, and/or decision may be based on factors not within their control, such as incomplete or inaccurate data provided by me or distortions of diagnostic images or specimens that may result from electronic transmissions.  I understand that the practice of medicine is not an exact science and that Practitioner makes no warranties or guarantees regarding treatment outcomes. I acknowledge that I will receive a copy of this consent concurrently upon execution via email to the email address I last provided but may also request a printed copy by calling the office of the [facility].  I understand that my insurance will be billed for this visit.   I have read or had this consent read to me.  I understand the contents of this consent, which adequately explains the benefits and risks of the Services being provided via telemedicine.  I have been provided ample opportunity to ask questions regarding this consent and the Services and have had my questions answered to my satisfaction.  I give my informed consent for the services to be provided through the use of telemedicine in my medical care  By participating in this telemedicine visit I agree to the above.

## 2019-01-31 ENCOUNTER — Ambulatory Visit (HOSPITAL_COMMUNITY)
Admission: RE | Admit: 2019-01-31 | Discharge: 2019-01-31 | Disposition: A | Discharge status: Home / Self Care | Payer: Commercial Managed Care - PPO | Source: Ambulatory Visit | Attending: Nurse Practitioner | Admitting: Nurse Practitioner

## 2019-01-31 ENCOUNTER — Other Ambulatory Visit: Payer: Self-pay

## 2019-01-31 DIAGNOSIS — I480 Paroxysmal atrial fibrillation: Secondary | ICD-10-CM

## 2019-01-31 NOTE — Progress Notes (Signed)
Electrophysiology TeleHealth Note   Due to national recommendations of social distancing due to [place] 19, video telehealth visit is felt to be most appropriate for this patient at this time.  See MyChart message/consent below  from today for patient consent regarding telehealth for the [facility].    Date:  01/31/2019   ID:  Kaitlin Cortez, DOB 1955/05/24, MRN [id number]  Location: home  Provider location: [address] [place], [place] [address] Evaluation Performed: New patient consult   PCP:  Inda Coke, PA  Primary Cardiologist: Dr. Percival Spanish Primary Electrophysiologist: None    CC: I had two recent afib epiosdes   History of Present Illness: Kaitlin Cortez is a 63 y.o. female who presents via video conferencing for a telehealth visit today.   The patient was referred for new consultation regarding  paroxysmal afib  by Dr Percival Spanish. Pt had 2 recent episodes that is out of the norm for her with fast ventricular rates. She is on flecainide 50 mg bid but not any rate control to oppose flecainide 2/2 to slow HR's/hypotension in the [place] area where she recently moved from. The worst episode lasted 4 hours. No known trigger.  She signed release for medical records in August but I cannot find the records  in  [place]. Pt is not on anticoagulation for a CHA2DS2VASc score of 1(sex). Takes a 325 mg asa daily.  F/u video call 12/ 21. She  reports no further episodes of afib. She is planning to get an Alive Cor to track her episodes. She  was given 30 mg Cardizem  if needed for  afib e[piosodes.   Today, she denies symptoms of palpitations, chest pain, shortness of breath, orthopnea, PND, lower extremity edema, claudication, dizziness, presyncope, syncope, bleeding, or neurologic sequela. The patient is tolerating medications without difficulties and is otherwise without complaint today.   she denies symptoms of cough, fevers, chills, or new SOB worrisome for COVID 19.      Atrial Fibrillation Risk Factors:  she does not have symptoms or diagnosis of sleep apnea. she does not have a history of rheumatic fever. she does not have a history of alcohol use.   she has a BMI of There is no height or weight on file to calculate BMI.. There were no vitals filed for this visit.  Past Medical History:  Diagnosis Date  . GERD (gastroesophageal reflux disease)   . Hyperlipidemia   . Paroxysmal atrial fibrillation ([facility]) 09/28/2018   Past Surgical History:  Procedure Laterality Date  . CARPAL TUNNEL RELEASE Bilateral 2015  . CHOLECYSTECTOMY  1998  . DIAGNOSTIC LAPAROSCOPY WITH REMOVAL OF ECTOPIC PREGNANCY Right 1996     Current Outpatient Medications  Medication Sig Dispense Refill  . amoxicillin-clavulanate (AUGMENTIN) 875-125 MG tablet Take 1 tablet by mouth 2 (two) times daily. 20 tablet 0  . aspirin 325 MG tablet Take 325 mg by mouth daily.    Marland Kitchen diltiazem (CARDIZEM) 30 MG tablet Take 1 Tablet Every 4 Hours As Needed For HR >100 45 tablet 2  . flecainide (TAMBOCOR) 50 MG tablet Take 1 tablet (50 mg total) by mouth 2 (two) times daily. 180 tablet 3  . Multiple Vitamin (MULTIVITAMIN) tablet Take 1 tablet by mouth daily.    . pantoprazole (PROTONIX) 40 MG tablet Take 40 mg by mouth at bedtime.      No current facility-administered medications for this encounter.    Allergies:   Statins   Social History:  The patient  reports that she has never smoked. She has never used smokeless tobacco. She reports current alcohol use of about 1.0 standard drinks of alcohol per week. She reports that she does not use drugs.   Family History:  The patient's  family history includes AAA (abdominal aortic aneurysm) in her mother; Dementia in her father.    ROS:  Please see the history of present illness.   All other systems are personally reviewed and negative.   Exam: Well appearing, alert and conversant, regular work of breathing,  good skin color   Recent  Labs: 09/29/2018: TSH 2.60 11/08/2018: ALT 11; BUN 11; Creatinine, Ser 1.11; Hemoglobin 14.0; Platelets 204.0; Potassium 4.3; Sodium 141  personally reviewed    Other studies personally reviewed: Epic records reviewed   ASSESSMENT AND PLAN:  1. Paroxysmal atrial fibrillation Recent increase in episodes No  episodes since we last talked Options discussed with pt  On last visit, I  did not feel comfortable with increase in flecainide unless rate control on board Pt does not think she can tolerate for the brady/hypotension Ablation discussed which she would want to defer now with covid Change  to Multaq also discussed  Pt wanted  like a conservative approach, so for now she has  diltaizem 30 mg   to use as needed for afib episodes  Pt will continue to monitor with her watch/ ALive Core    2. CHA2DS2VAScThis patients CHA2DS2-VASc Score and unadjusted Ischemic Stroke Rate (% per year) is equal to 0.6 % stroke rate/year from a score of 1  Pt is taking  325 mg asa daily  Above score calculated as 1 point each if present [CHF, HTN, DM, Vascular=MI/PAD/Aortic Plaque, Age if 65-74, or Female] Above score calculated as 2 points each if present [Age > 75, or Stroke/TIA/TE]  Follow-up:  As needed   Current medicines are reviewed at length with the patient today.   The patient does not have concerns regarding her medicines.  The following changes were made today:  none  Labs/ tests ordered today include: none No orders of the defined types were placed in this encounter.   Patient Risk:  after full review of this patients clinical status, I feel that they are at mod  risk at this time.   Today, I have spent 10 minutes with the patient with telehealth technology discussing afib management  .    Don Perking NP  01/31/2019 3:25 PM  Afib Clinic [facility] [address] [place], [place] [address] [phone]   I hereby voluntarily request, consent and authorize  the [facility] Clinic and its employed or contracted physicians, physician assistants, nurse practitioners or other licensed health care professionals (the Practitioner), to provide me with telemedicine health care services (the "Services") as deemed necessary by the treating Practitioner. I acknowledge and consent to receive the Services by the Practitioner via telemedicine. I understand that the telemedicine visit will involve communicating with the Practitioner through live audiovisual communication technology and the disclosure of certain medical information by electronic transmission. I acknowledge that I have been given the opportunity to request an in-person assessment or other available alternative prior to the telemedicine visit and am voluntarily participating in the telemedicine visit.   I understand that I have the right to withhold or withdraw my consent to the use of telemedicine in the course of my care at any time, without affecting my right to future care or treatment, and that the Practitioner or I may terminate  the telemedicine visit at any time. I understand that I have the right to inspect all information obtained and/or recorded in the course of the telemedicine visit and may receive copies of available information for a reasonable fee.  I understand that some of the potential risks of receiving the Services via telemedicine include:   Delay or interruption in medical evaluation due to technological equipment failure or disruption;  Information transmitted may not be sufficient (e.g. poor resolution of images) to allow for appropriate medical decision making by the Practitioner; and/or  In rare instances, security protocols could fail, causing a breach of personal health information.   Furthermore, I acknowledge that it is my responsibility to provide information about my medical history, conditions and care that is complete and accurate to the best of my ability. I acknowledge  that Practitioner's advice, recommendations, and/or decision may be based on factors not within their control, such as incomplete or inaccurate data provided by me or distortions of diagnostic images or specimens that may result from electronic transmissions. I understand that the practice of medicine is not an exact science and that Practitioner makes no warranties or guarantees regarding treatment outcomes. I acknowledge that I will receive a copy of this consent concurrently upon execution via email to the email address I last provided but may also request a printed copy by calling the office of the [facility] Clinic.  I understand that my insurance will be billed for this visit.   I have read or had this consent read to me.  I understand the contents of this consent, which adequately explains the benefits and risks of the Services being provided via telemedicine.  I have been provided ample opportunity to ask questions regarding this consent and the Services and have had my questions answered to my satisfaction.  I give my informed consent for the services to be provided through the use of telemedicine in my medical care  By participating in this telemedicine visit I agree to the above.

## 2019-02-15 ENCOUNTER — Encounter: Payer: Self-pay | Admitting: Physician Assistant

## 2019-02-16 ENCOUNTER — Encounter: Payer: Self-pay | Admitting: Physician Assistant

## 2019-02-16 DIAGNOSIS — N289 Disorder of kidney and ureter, unspecified: Secondary | ICD-10-CM | POA: Insufficient documentation

## 2019-04-20 ENCOUNTER — Ambulatory Visit: Payer: Commercial Managed Care - PPO | Admitting: Physician Assistant

## 2019-04-20 ENCOUNTER — Other Ambulatory Visit: Payer: Self-pay

## 2019-04-20 ENCOUNTER — Encounter: Payer: Self-pay | Admitting: Physician Assistant

## 2019-04-20 VITALS — BP 118/60 | HR 60 | Temp 97.2°F | Ht 65.0 in | Wt 194.5 lb

## 2019-04-20 DIAGNOSIS — K219 Gastro-esophageal reflux disease without esophagitis: Secondary | ICD-10-CM

## 2019-04-20 DIAGNOSIS — R519 Headache, unspecified: Secondary | ICD-10-CM | POA: Diagnosis not present

## 2019-04-20 DIAGNOSIS — Z789 Other specified health status: Secondary | ICD-10-CM

## 2019-04-20 DIAGNOSIS — R5383 Other fatigue: Secondary | ICD-10-CM

## 2019-04-20 LAB — COMPREHENSIVE METABOLIC PANEL
ALT: 14 U/L (ref 0–35)
AST: 18 U/L (ref 0–37)
Albumin: 4 g/dL (ref 3.5–5.2)
Alkaline Phosphatase: 93 U/L (ref 39–117)
BUN: 13 mg/dL (ref 6–23)
CO2: 31 mEq/L (ref 19–32)
Calcium: 9.4 mg/dL (ref 8.4–10.5)
Chloride: 102 mEq/L (ref 96–112)
Creatinine, Ser: 1.1 mg/dL (ref 0.40–1.20)
GFR: 50.16 mL/min — ABNORMAL LOW (ref >60.00)
Glucose, Bld: 120 mg/dL — ABNORMAL HIGH (ref 70–99)
Potassium: 4.1 mEq/L (ref 3.5–5.1)
Sodium: 139 mEq/L (ref 135–145)
Total Bilirubin: 0.5 mg/dL (ref 0.2–1.2)
Total Protein: 6.5 g/dL (ref 6.0–8.3)

## 2019-04-20 LAB — VITAMIN B12: Vitamin B-12: 367 pg/mL (ref 211–911)

## 2019-04-20 NOTE — Patient Instructions (Signed)
It was great to see you!  Protonix There is much written and talked about possible health problems with the use of PPI medications (omeprazole (Prilosec), esomeprazole (Nexium), rabeprazole (Aciphex), lansoprazole (Prevacid), Dexlansoprazole (Dexilant) and pantoprazole ( Protonix).  The truth is the studies that suggest health problems with the use of PPI medications only find a weak association at best with the possible diseases reported to occur like dementia, bone fracture and kidney failure and even death. Many sicker patients with multiple health problems end up on these medications for various reasons. Establishing cause and effect in medical research is extremely difficult and though I believe there are very rare occurrences in which PPI's do cause harm in these ways, the benefits of you taking your PPI outweigh these largely theoretical and sometimes directly disproven risks.  As always, I/we will strive to have you on the lowest effective dose and frequency of a medication to keep you well and with a good quality of life.   I will refer you to GI for further evaluation and management.  For your sinus issues: Upper respiratory infection recommendations for those with current or history of elevated blood pressure: 1. Avoid all over-the-counter antihistamines except Claritin/Loratadine and Zyrtec/Cetrizine. 2. Avoid all combination including cold sinus allergies flu decongestant and sleep medications  3. You can use Robitussin DM Mucinex and Mucinex DM for cough. 4. You can use Coricidin HBP products.    Take care,  Jarold Motto PA-C

## 2019-04-20 NOTE — Telephone Encounter (Signed)
Please call pt and schedule an appointment to discuss issues.

## 2019-04-20 NOTE — Progress Notes (Signed)
Kaitlin Cortez is a 64 y.o. female here for a new problem.  I acted as a Neurosurgeon for [employer], PA-C Corky Mull, LPN  History of Present Illness:   Chief Complaint  Patient presents with  . Medication Problem    HPI   GERD Pt would like to discuss medication pantoprazole that she takes for GERD. She thinks she might be having side effects from it. Pt read the adverse reactions and she is having a lot of the symptoms: fatigue, headaches and feeling blah. Pt has been on the medication for 8 years and read that its not supposed to be used long-term use.  Patient has stopped her Protonix for 2 days and had significant recurrence of symptoms.  Even when she is taking her medication appropriately she has breakthrough heartburn where she will also take Pepto-Bismol for.   She does have a known hiatal hernia. She has a relatively acidic diet with lots of tomatoes, daily chocolate and coffee and occasional grapefruit use. Denies rectal bleeding or unintentional weight loss.  Headache/fatigue Patient reports that she has had intermittent issues with sinus headache (dull, frontal) and fatigue since moving to [place].  She has gained weight since the pandemic.  She is now starting to exercise more.  She is a vegan and does not take additional B12 supplement other than the 50 mcg that are in her daily multivitamin.  She feels as though she is having worse allergies here than she has at her prior residence.  She is taking more Motrin than she ever has per her report.   Wt Readings from Last 4 Encounters:  04/20/19 194 lb 8 oz (88.2 kg)  12/03/18 184 lb (83.5 kg)  11/08/18 182 lb 4 oz (82.7 kg)  10/05/18 178 lb 6.4 oz (80.9 kg)     Past Medical History:  Diagnosis Date  . GERD (gastroesophageal reflux disease)   . Hyperlipidemia   . Paroxysmal atrial fibrillation ([facility]) 09/28/2018     Social History   Socioeconomic History  . Marital status: Married    Spouse name: Not on file   . Number of children: Not on file  . Years of education: Not on file  . Highest education level: Not on file  Occupational History  . Not on file  Tobacco Use  . Smoking status: Never Smoker  . Smokeless tobacco: Never Used  Substance and Sexual Activity  . Alcohol use: Yes    Alcohol/week: 1.0 standard drinks    Types: 1 Glasses of wine per week  . Drug use: Never  . Sexual activity: Yes    Birth control/protection: None  Other Topics Concern  . Not on file  Social History Narrative   Moved from [place].     Social Determinants of Health   Financial Resource Strain:   . Difficulty of Paying Living Expenses: Not on file  Food Insecurity:   . Worried About Programme researcher, broadcasting/film/video in the Last Year: Not on file  . Ran Out of Food in the Last Year: Not on file  Transportation Needs:   . Lack of Transportation (Medical): Not on file  . Lack of Transportation (Non-Medical): Not on file  Physical Activity:   . Days of Exercise per Week: Not on file  . Minutes of Exercise per Session: Not on file  Stress:   . Feeling of Stress : Not on file  Social Connections:   . Frequency of Communication with Friends and Family: Not on file  .  Frequency of Social Gatherings with Friends and Family: Not on file  . Attends Religious Services: Not on file  . Active Member of Clubs or Organizations: Not on file  . Attends Archivist Meetings: Not on file  . Marital Status: Not on file  Intimate Partner Violence:   . Fear of Current or Ex-Partner: Not on file  . Emotionally Abused: Not on file  . Physically Abused: Not on file  . Sexually Abused: Not on file    Past Surgical History:  Procedure Laterality Date  . CARPAL TUNNEL RELEASE Bilateral 2015  . CHOLECYSTECTOMY  1998  . DIAGNOSTIC LAPAROSCOPY WITH REMOVAL OF ECTOPIC PREGNANCY Right 1996    Family History  Problem Relation Age of Onset  . AAA (abdominal aortic aneurysm) Mother   . Dementia Father     Allergies   Allergen Reactions  . Statins Other (See Comments)    myalgias    Current Medications:   Current Outpatient Medications:  .  aspirin 325 MG tablet, Take 325 mg by mouth daily., Disp: , Rfl:  .  diltiazem (CARDIZEM) 30 MG tablet, Take 1 Tablet Every 4 Hours As Needed For HR >100, Disp: 45 tablet, Rfl: 2 .  flecainide (TAMBOCOR) 50 MG tablet, Take 1 tablet (50 mg total) by mouth 2 (two) times daily., Disp: 180 tablet, Rfl: 3 .  metroNIDAZOLE (METROGEL) 1 % gel, Apply 1 application topically daily., Disp: , Rfl:  .  Multiple Vitamin (MULTIVITAMIN) tablet, Take 1 tablet by mouth daily., Disp: , Rfl:  .  pantoprazole (PROTONIX) 40 MG tablet, Take 40 mg by mouth at bedtime. , Disp: , Rfl:    Review of Systems:   ROS  Negative unless otherwise specified per HPI.   Vitals:   Vitals:   04/20/19 1337  BP: 118/60  Pulse: 60  Temp: (!) 97.2 F (36.2 C)  TempSrc: Temporal  SpO2: 96%  Weight: 194 lb 8 oz (88.2 kg)  Height: 5\' 5"  (1.651 m)     Body mass index is 32.37 kg/m.  Physical Exam:   Physical Exam Vitals and nursing note reviewed.  Constitutional:      General: She is not in acute distress.    Appearance: She is well-developed. She is not ill-appearing or toxic-appearing.  Cardiovascular:     Rate and Rhythm: Normal rate and regular rhythm.     Pulses: Normal pulses.     Heart sounds: Normal heart sounds, S1 normal and S2 normal.     Comments: No LE edema Pulmonary:     Effort: Pulmonary effort is normal.     Breath sounds: Normal breath sounds.  Skin:    General: Skin is warm and dry.  Neurological:     Mental Status: She is alert.     GCS: GCS eye subscore is 4. GCS verbal subscore is 5. GCS motor subscore is 6.  Psychiatric:        Speech: Speech normal.        Behavior: Behavior normal. Behavior is cooperative.       Assessment and Plan:   [name] was seen today for medication problem.  Diagnoses and all orders for this visit:  Vegan diet We will  update her vitamin B12 level to determine need for supplementation. -     Vitamin B12  Fatigue, unspecified type; Nonintractable headache, unspecified chronicity pattern, unspecified headache type We will update CMP and check a vitamin B12.  Discussed avoiding use of Motrin as able.  We also  discussed appropriate allergy and sinus treatment for high blood pressure/heart conditions.  Recommended close follow-up if symptoms do not improve or persist.  Patient verbalized understanding to plan.  Also recommended generalized better eating, more exercise with goal of weight loss. -     Comprehensive metabolic panel  Gastroesophageal reflux disease, unspecified whether esophagitis present Because she is having such severe symptoms when she stops her Protonix we will withhold from discontinuation at this time.  Reviewed that in this case, benefit of taking this medication long term outweighs risk.  Provided her with information aon after visit summary about the safety of this medication.  Referral to GI for further evaluation management.  -     Ambulatory referral to Gastroenterology  . Reviewed expectations re: course of current medical issues. . Discussed self-management of symptoms. . Outlined signs and symptoms indicating need for more acute intervention. . Patient verbalized understanding and all questions were answered. . See orders for this visit as documented in the electronic medical record. . Patient received an After-Visit Summary.  CMA or LPN served as scribe during this visit. History, Physical, and Plan performed by medical provider. The above documentation has been reviewed and is accurate and complete.   Jarold Motto, PA-C

## 2019-04-20 NOTE — Telephone Encounter (Signed)
Patient is coming today to see Sam.

## 2019-04-21 ENCOUNTER — Encounter: Payer: Self-pay | Admitting: Gastroenterology

## 2019-04-21 ENCOUNTER — Ambulatory Visit: Payer: Commercial Managed Care - PPO | Admitting: Gastroenterology

## 2019-04-21 VITALS — BP 102/62 | HR 65 | Temp 97.3°F | Ht 65.0 in | Wt 194.2 lb

## 2019-04-21 DIAGNOSIS — K219 Gastro-esophageal reflux disease without esophagitis: Secondary | ICD-10-CM

## 2019-04-21 DIAGNOSIS — Z01818 Encounter for other preprocedural examination: Secondary | ICD-10-CM

## 2019-04-21 NOTE — Patient Instructions (Signed)
If you are age 63 or older, your body mass index should be between 23-30. Your Body mass index is 32.32 kg/m. If this is out of the aforementioned range listed, please consider follow up with your Primary Care Provider.  If you are age 45 or younger, your body mass index should be between 19-25. Your Body mass index is 32.32 kg/m. If this is out of the aformentioned range listed, please consider follow up with your Primary Care Provider.   You have been scheduled for an endoscopy. Please follow written instructions given to you at your visit today. If you use inhalers (even only as needed), please bring them with you on the day of your procedure. Your physician has requested that you go to www.startemmi.com and enter the access code given to you at your visit today. This web site gives a general overview about your procedure. However, you should still follow specific instructions given to you by our office regarding your preparation for the procedure.  It was a pleasure to see you today!  Dr. Myrtie Neither

## 2019-04-21 NOTE — Progress Notes (Signed)
[facility] Consult Note:  History: Kaitlin Cortez 04/21/2019  Referring provider: Inda Coke, PA  Reason for consult/chief complaint: Gastroesophageal Reflux (patient has had for years, but seems to have breakthrough heartburn more recently) and Gas   Subjective  HPI:  This is a very pleasant 64 year old woman referred by primary care for longstanding reflux symptoms.  She has had many years of regurgitation and pyrosis occurring both daytime and overnight, and has been on once daily pantoprazole for at least 8 to 10 years.  She typically takes that medicine at bedtime, and it works fairly well, but she has breakthrough episodes several times a week.  Kaitlin Cortez has been on a vegan diet for many years, is a non-smoker, rarely has alcohol, and usually evening meal is consumed 7 or 7:30 PM.  It is difficult to have that earlier due to her family and work schedule. She denies nausea, vomiting, dysphagia, odynophagia or weight loss.  Bowel habits are regular without rectal bleeding.  She has had at least 2 colonoscopies in [place] prior to coming here, and may have started early due to family history of colon polyps in her father.  She says her last 2 colonoscopies were normal, last one being about 4 years ago, with recommendations to have a repeat at a 10-year interval.  Those records are not on file in her chart, and she says she will try to contact that practice to get a specific date so we will know when to place her recall.  Kaitlin Cortez was asking about her PPI medicine, and whether there was any better long-term solution to her reflux symptoms.  If she goes without the medicine for a couple of days she has more frequent pyrosis and feelings of regurgitation.   ROS:  Review of Systems  Constitutional: Negative for appetite change and unexpected weight change.  HENT: Negative for mouth sores and voice change.   Eyes: Negative for pain and redness.  Respiratory: Negative for  cough and shortness of breath.   Cardiovascular: Negative for chest pain and palpitations.  Genitourinary: Negative for dysuria and hematuria.  Musculoskeletal: Negative for arthralgias and myalgias.  Skin: Negative for pallor and rash.  Neurological: Negative for weakness and headaches.  Hematological: Negative for adenopathy.    Past Medical History: Past Medical History:  Diagnosis Date  . GERD (gastroesophageal reflux disease)   . Hyperlipidemia   . Paroxysmal atrial fibrillation ([facility]) 09/28/2018     Past Surgical History: Past Surgical History:  Procedure Laterality Date  . CARPAL TUNNEL RELEASE Bilateral 2015  . CHOLECYSTECTOMY  1998  . DIAGNOSTIC LAPAROSCOPY WITH REMOVAL OF ECTOPIC PREGNANCY Right 1996     Family History: Family History  Problem Relation Age of Onset  . AAA (abdominal aortic aneurysm) Mother   . Dementia Father   . Colon polyps Father   . Throat cancer Father   . Esophageal cancer Neg Hx   . Colon cancer Neg Hx   . Pancreatic cancer Neg Hx   . Stomach cancer Neg Hx     Social History: Social History   Socioeconomic History  . Marital status: Married    Spouse name: Not on file  . Number of children: Not on file  . Years of education: Not on file  . Highest education level: Not on file  Occupational History  . Not on file  Tobacco Use  . Smoking status: Never Smoker  . Smokeless tobacco: Never Used  Substance and Sexual Activity  . Alcohol use:  Yes    Alcohol/week: 1.0 standard drinks    Types: 1 Glasses of wine per week    Comment: occ  . Drug use: Never  . Sexual activity: Yes    Birth control/protection: None  Other Topics Concern  . Not on file  Social History Narrative   Moved from [place].     Social Determinants of Health   Financial Resource Strain:   . Difficulty of Paying Living Expenses:   Food Insecurity:   . Worried About Programme researcher, broadcasting/film/video in the Last Year:   . Barista in the Last Year:     Transportation Needs:   . Freight forwarder (Medical):   Marland Kitchen Lack of Transportation (Non-Medical):   Physical Activity:   . Days of Exercise per Week:   . Minutes of Exercise per Session:   Stress:   . Feeling of Stress :   Social Connections:   . Frequency of Communication with Friends and Family:   . Frequency of Social Gatherings with Friends and Family:   . Attends Religious Services:   . Active Member of Clubs or Organizations:   . Attends Banker Meetings:   Marland Kitchen Marital Status:     Allergies: Allergies  Allergen Reactions  . Statins Other (See Comments)    myalgias    Outpatient Meds: Current Outpatient Medications  Medication Sig Dispense Refill  . aspirin 325 MG tablet Take 325 mg by mouth daily.    Marland Kitchen diltiazem (CARDIZEM) 30 MG tablet Take 1 Tablet Every 4 Hours As Needed For HR >100 45 tablet 2  . flecainide (TAMBOCOR) 50 MG tablet Take 1 tablet (50 mg total) by mouth 2 (two) times daily. 180 tablet 3  . metroNIDAZOLE (METROGEL) 1 % gel Apply 1 application topically daily.    . Multiple Vitamin (MULTIVITAMIN) tablet Take 1 tablet by mouth daily.    . pantoprazole (PROTONIX) 40 MG tablet Take 40 mg by mouth at bedtime.      No current facility-administered medications for this visit.      ___________________________________________________________________ Objective   Exam:  BP 102/62 (BP Location: Left Arm, Patient Position: Sitting, Cuff Size: Normal)   Pulse 65   Temp (!) 97.3 F (36.3 C)   Ht 5\' 5"  (1.651 m)   Wt 194 lb 4 oz (88.1 kg)   SpO2 98%   BMI 32.32 kg/m    General: Well-appearing, normal vocal quality  Eyes: sclera anicteric, no redness  ENT: oral mucosa moist without lesions, no cervical or supraclavicular lymphadenopathy  CV: RRR without murmur, S1/S2, no JVD, no peripheral edema  Resp: clear to auscultation bilaterally, normal RR and effort noted  GI: soft, no tenderness, with active bowel sounds. No guarding or  palpable organomegaly noted.  Skin; warm and dry, no rash or jaundice noted  Neuro: awake, alert and oriented x 3. Normal gross motor function and fluent speech  Labs:  CBC Latest Ref Rng & Units 11/08/2018 09/29/2018  WBC 4.0 - 10.5 K/uL 6.5 4.4  Hemoglobin 12.0 - 15.0 g/dL 10/01/2018 38.9  Hematocrit 37.3 - 46.0 % 41.9 41.6  Platelets 150.0 - 400.0 K/uL 204.0 187.0   CMP Latest Ref Rng & Units 04/20/2019 11/08/2018 10/05/2018  Glucose 70 - 99 mg/dL 10/07/2018) 71 87  BUN 6 - 23 mg/dL 13 11 12   Creatinine 0.40 - 1.20 mg/dL 768(T 1.57)  Sodium 135 - 145 mEq/L 139 141 142  Potassium 3.5 - 5.1 mEq/L 4.1 4.3 4.3  Chloride 96 - 112 mEq/L 102 101 104  CO2 19 - 32 mEq/L 31 31 27   Calcium 8.4 - 10.5 mg/dL 9.4 9.9  Total Protein 6.0 - 8.3 g/dL 6.5 6.8 -  Total Bilirubin 0.2 - 1.2 mg/dL 0.5 0.5 -  Alkaline Phos 39 - 117 U/L 93 83 -  AST 0 - 37 U/L 18 18 -  ALT 0 - 35 U/L 14 11 -     Assessment: Encounter Diagnoses  Name Primary?  . Gastroesophageal reflux disease, unspecified whether esophagitis present Yes  . Preprocedural examination     Longstanding GERD with breakthrough symptoms despite PPI therapy, non-smoker, very little alcohol, healthy diet and lifestyle.  She is only modestly overweight.  Chynah seem to recall being told she had a hiatal hernia at the time of what sounds like ERCP for CBD stones after cholecystectomy.  We discussed GERD, as usual triggers, and limitation of acid suppression therapy.  We also discussed possible long-term control with TIF or fundoplication.  Plan:  Upper endoscopy.  This would help assess for esophagitis, screen for Barrett's, evaluate hiatal hernia.  With that, we can then discuss best plan for medical therapy and if she may be a candidate for endoscopic or surgical solution to reflux. She was agreeable after discussion of procedure and risks.  The benefits and risks of the planned procedure were described in detail with the patient or (when  appropriate) their health care proxy.  Risks were outlined as including, but not limited to, bleeding, infection, perforation, adverse medication reaction leading to cardiac or pulmonary decompensation, pancreatitis (if ERCP).  The limitation of incomplete mucosal visualization was also discussed.  No guarantees or warranties were given.   In the meantime, my only suggestion to change medical management was to retime the PPI to evening meal rather than at bedtime.  Thank you for the courtesy of this consult.  Please call me with any questions or concerns.  43.3 III  CC: Referring provider noted above

## 2019-04-22 ENCOUNTER — Other Ambulatory Visit: Payer: Self-pay | Admitting: Physician Assistant

## 2019-05-09 ENCOUNTER — Encounter: Payer: Self-pay | Admitting: Physician Assistant

## 2019-05-18 ENCOUNTER — Other Ambulatory Visit: Payer: Self-pay | Admitting: Gastroenterology

## 2019-05-18 ENCOUNTER — Ambulatory Visit (INDEPENDENT_AMBULATORY_CARE_PROVIDER_SITE_OTHER): Payer: Commercial Managed Care - PPO

## 2019-05-18 DIAGNOSIS — Z1159 Encounter for screening for other viral diseases: Secondary | ICD-10-CM

## 2019-05-18 LAB — SARS CORONAVIRUS 2 (TAT 6-24 HRS): SARS Coronavirus 2: NEGATIVE

## 2019-05-20 ENCOUNTER — Ambulatory Visit (AMBULATORY_SURGERY_CENTER): Payer: Commercial Managed Care - PPO | Admitting: Gastroenterology

## 2019-05-20 ENCOUNTER — Encounter: Payer: Self-pay | Admitting: Gastroenterology

## 2019-05-20 ENCOUNTER — Other Ambulatory Visit: Payer: Self-pay

## 2019-05-20 VITALS — BP 112/68 | HR 56 | Temp 97.7°F | Resp 17 | Ht 65.0 in | Wt 194.0 lb

## 2019-05-20 DIAGNOSIS — K219 Gastro-esophageal reflux disease without esophagitis: Secondary | ICD-10-CM

## 2019-05-20 DIAGNOSIS — K317 Polyp of stomach and duodenum: Secondary | ICD-10-CM | POA: Diagnosis not present

## 2019-05-20 MED ORDER — SODIUM CHLORIDE 0.9 % IV SOLN: 500.0000 mL | Freq: Once | INTRAVENOUS | Status: DC

## 2019-05-20 NOTE — Patient Instructions (Signed)
YOU HAD AN ENDOSCOPIC PROCEDURE TODAY AT THE [facility]:   Refer to the procedure report that was given to you for any specific questions about what was found during the examination.  If the procedure report does not answer your questions, please call your gastroenterologist to clarify.  If you requested that your care partner not be given the details of your procedure findings, then the procedure report has been included in a sealed envelope for you to review at your convenience later.  YOU SHOULD EXPECT: Some feelings of bloating in the abdomen. Passage of more gas than usual.  Walking can help get rid of the air that was put into your GI tract during the procedure and reduce the bloating. If you had a lower endoscopy (such as a colonoscopy or flexible sigmoidoscopy) you may notice spotting of blood in your stool or on the toilet paper. If you underwent a bowel prep for your procedure, you may not have a normal bowel movement for a few days.  Please Note:  You might notice some irritation and congestion in your nose or some drainage.  This is from the oxygen used during your procedure.  There is no need for concern and it should clear up in a day or so.  SYMPTOMS TO REPORT IMMEDIATELY:    Following upper endoscopy (EGD)  Vomiting of blood or coffee ground material  New chest pain or pain under the shoulder blades  Painful or persistently difficult swallowing  New shortness of breath  Fever of 100F or higher  Black, tarry-looking stools  For urgent or emergent issues, a gastroenterologist can be reached at any hour by calling [phone]. Do not use MyChart messaging for urgent concerns.    DIET:  We do recommend a small meal at first, but then you may proceed to your regular diet.  Drink plenty of fluids but you should avoid alcoholic beverages for 24 hours.  ACTIVITY:  You should plan to take it easy for the rest of today and you should NOT DRIVE or use heavy machinery  until tomorrow (because of the sedation medicines used during the test).    FOLLOW UP: Our staff will call the number listed on your records 48-72 hours following your procedure to check on you and address any questions or concerns that you may have regarding the information given to you following your procedure. If we do not reach you, we will leave a message.  We will attempt to reach you two times.  During this call, we will ask if you have developed any symptoms of COVID 19. If you develop any symptoms (ie: fever, flu-like symptoms, shortness of breath, cough etc.) before then, please call [phone].  If you test positive for Covid 19 in the 2 weeks post procedure, please call and report this information to us.    If any biopsies were taken you will be contacted by phone or by letter within the next 1-3 weeks.  Please call us at [phone] if you have not heard about the biopsies in 3 weeks.    SIGNATURES/CONFIDENTIALITY: You and/or your care partner have signed paperwork which will be entered into your electronic medical record.  These signatures attest to the fact that that the information above on your After Visit Summary has been reviewed and is understood.  Full responsibility of the confidentiality of this discharge information lies with you and/or your care-partner. 

## 2019-05-20 NOTE — Op Note (Signed)
[facility] Patient Name: Kaitlin Cortez Procedure Date: 05/20/2019 1:59 PM MRN: [id number] Endoscopist: [place]. Loletha Carrow , MD Age: 64 Referring MD:  Date of Birth: 21-Oct-1955 Gender: Female Account #: [id number] Procedure:                Upper GI endoscopy Indications:              Esophageal reflux symptoms that persist despite                            appropriate therapy Medicines:                Monitored Anesthesia Care Procedure:                Pre-Anesthesia Assessment:                           - Prior to the procedure, a History and Physical                            was performed, and patient medications and                            allergies were reviewed. The patient's tolerance of                            previous anesthesia was also reviewed. The risks                            and benefits of the procedure and the sedation                            options and risks were discussed with the patient.                            All questions were answered, and informed consent                            was obtained. Prior Anticoagulants: The patient has                            taken no previous anticoagulant or antiplatelet                            agents. ASA Grade Assessment: II - A patient with                            mild systemic disease. After reviewing the risks                            and benefits, the patient was deemed in                            satisfactory condition to undergo the procedure.  After obtaining informed consent, the endoscope was                            passed under direct vision. Throughout the                            procedure, the patient's blood pressure, pulse, and                            oxygen saturations were monitored continuously. The                            Endoscope was introduced through the mouth, and                            advanced to the second part of  duodenum. The upper                            GI endoscopy was accomplished without difficulty.                            The patient tolerated the procedure well. Scope In: Scope Out: Findings:                 The esophagus was normal. Specifically, no                            esophagitis was seen.                           A few sessile fundic gland polyps were found in the                            gastric fundus and in the gastric body.                           The exam of the stomach was otherwise normal.                           The cardia and gastric fundus were normal on                            retroflexion. (Hill Grade 3)                           The examined duodenum was normal. Complications:            No immediate complications. Estimated Blood Loss:     Estimated blood loss: none. Impression:               - Normal esophagus.                           - A few fundic gland polyps.                           -  Normal examined duodenum.                           - No specimens collected. Recommendation:           - Patient has a contact number available for                            emergencies. The signs and symptoms of potential                            delayed complications were discussed with the                            patient. Return to normal activities tomorrow.                            Written discharge instructions were provided to the                            patient.                           - Resume previous diet.                           - Continue present medications.                           - Will discuss with colleague this patient's                            candidacy for TIF. [name] L. Myrtie Neither, MD 05/20/2019 2:16:01 PM This report has been signed electronically.

## 2019-05-20 NOTE — Progress Notes (Signed)
VS by CW. Temp by JB. 

## 2019-05-20 NOTE — Progress Notes (Signed)
Report given to PACU, vss 

## 2019-05-23 NOTE — Progress Notes (Signed)
Lmom for patient to call back 

## 2019-05-24 ENCOUNTER — Telehealth: Payer: Self-pay

## 2019-05-24 NOTE — Telephone Encounter (Signed)
LVM

## 2019-05-24 NOTE — Telephone Encounter (Signed)
Second post procedure follow up call, no answer 

## 2019-05-27 NOTE — Progress Notes (Signed)
Unable to reach patient by phone. A letter has been mailed out to contact the office for an appointment.

## 2019-06-13 ENCOUNTER — Encounter: Payer: Self-pay | Admitting: Physician Assistant

## 2019-06-13 NOTE — Telephone Encounter (Signed)
Left message on voicemail to call office.  

## 2019-07-28 ENCOUNTER — Encounter: Payer: Self-pay | Admitting: Physician Assistant

## 2019-07-28 MED ORDER — PANTOPRAZOLE SODIUM 40 MG PO TBEC: 40.0000 mg | DELAYED_RELEASE_TABLET | Freq: Every day | ORAL | 0 refills | Status: DC

## 2019-07-28 NOTE — Telephone Encounter (Signed)
Okay to fill Rx for Pantoprazole 40 mg?

## 2019-08-24 ENCOUNTER — Telehealth: Payer: Self-pay | Admitting: Cardiology

## 2019-08-24 NOTE — Telephone Encounter (Signed)
LVM for patient to return call to get f/u scheduled with [facility] from recall list

## 2019-10-03 ENCOUNTER — Other Ambulatory Visit: Payer: Self-pay | Admitting: Cardiology

## 2019-10-03 DIAGNOSIS — Z7189 Other specified counseling: Secondary | ICD-10-CM | POA: Insufficient documentation

## 2019-10-03 NOTE — Progress Notes (Signed)
Cardiology Office Note   Date:  10/04/2019   ID:  Kaitlin Cortez, DOB 08-17-1955, MRN [id number]  PCP:  Jarold Motto, PA  Cardiologist:   No primary care provider on file. Referring:  Jarold Motto, PA  Chief Complaint  Patient presents with  . Atrial Fibrillation      History of Present Illness: Kaitlin Cortez is a 64 y.o. female who is referred by Jarold Motto, PA to evaluate for atrial fib.  She has a history of this and moved from [place].  She has been treated with Flecainide.  She said she has had atrial fibrillation for years.  She never really tolerated beta-blockers or apparently calcium channel blockers.  She gets a very low heart rate.    Since I saw her she has had no tachypalpitations other than may be a few beats.  She does not think she has had any sustained atrial fibrillation.  Denies any cardiovascular symptoms.  She exercises. The patient denies any new symptoms such as chest discomfort, neck or arm discomfort. There has been no new shortness of breath, PND or orthopnea. There have been no reported palpitations, presyncope or syncope.    Past Medical History:  Diagnosis Date  . GERD (gastroesophageal reflux disease)   . Hyperlipidemia   . Paroxysmal atrial fibrillation ([facility]) 09/28/2018    Past Surgical History:  Procedure Laterality Date  . CARPAL TUNNEL RELEASE Bilateral 2015  . CHOLECYSTECTOMY  1998  . DIAGNOSTIC LAPAROSCOPY WITH REMOVAL OF ECTOPIC PREGNANCY Right 1996     Current Outpatient Medications  Medication Sig Dispense Refill  . aspirin 325 MG tablet Take 325 mg by mouth daily.    Marland Kitchen diltiazem (CARDIZEM) 30 MG tablet Take 1 Tablet Every 4 Hours As Needed For HR >100 45 tablet 2  . flecainide (TAMBOCOR) 50 MG tablet TAKE 1 TABLET(50 MG) BY MOUTH TWICE DAILY 180 tablet 3  . metroNIDAZOLE (METROGEL) 1 % gel Apply 1 application topically daily.    . Multiple Vitamin (MULTIVITAMIN) tablet Take 1 tablet by mouth daily.    .  pantoprazole (PROTONIX) 40 MG tablet Take 1 tablet (40 mg total) by mouth at bedtime. 90 tablet 0   No current facility-administered medications for this visit.    Allergies:   Statins and Versed [midazolam]    ROS:  Please see the history of present illness.   Otherwise, review of systems are positive for none.   All other systems are reviewed and negative.    PHYSICAL EXAM: VS:  BP 104/72   Pulse (!) 57   Ht 5\' 5"  (1.651 m)   Wt 193 lb 6.4 oz (87.7 kg)   BMI 32.18 kg/m  , BMI Body mass index is 32.18 kg/m. GENERAL:  Well appearing NECK:  No jugular venous distention, waveform within normal limits, carotid upstroke brisk and symmetric, no bruits, no thyromegaly LUNGS:  Clear to auscultation bilaterally CHEST:  Unremarkable HEART:  PMI not displaced or sustained,S1 and S2 within normal limits, no S3, no S4, no clicks, no rubs, no murmurs ABD:  Flat, positive bowel sounds normal in frequency in pitch, no bruits, no rebound, no guarding, no midline pulsatile mass, no hepatomegaly, no splenomegaly EXT:  2 plus pulses throughout, no edema, no cyanosis no clubbing   EKG:  EKG is  ordered today. The ekg ordered today demonstrates sinus rhythm, rate 57, axis within normal limits, intervals within normal limits, no acute ST-T wave changes.   Recent Labs: 11/08/2018: Hemoglobin 14.0; Platelets 204.0 04/20/2019:  ALT 14; BUN 13; Creatinine, Ser 1.10; Potassium 4.1; Sodium 139    Lipid Panel    Component Value Date/Time   CHOL 242 (H) 09/29/2018 0829   TRIG 187.0 (H) 09/29/2018 0829   HDL 44.60 09/29/2018 0829   CHOLHDL 5 09/29/2018 0829   VLDL 37.4 09/29/2018 0829   LDLCALC 160 (H) 09/29/2018 0829      Wt Readings from Last 3 Encounters:  10/04/19 193 lb 6.4 oz (87.7 kg)  05/20/19 194 lb (88 kg)  04/21/19 194 lb 4 oz (88.1 kg)      Other studies Reviewed: Additional studies/ records that were reviewed today include: Labs. Review of the above records demonstrates:  Please  see elsewhere in the note.     ASSESSMENT AND PLAN:  PAF:   She has had no symptomatic paroxysms.  She will remain on the low-dose of flecainide that she is now taking twice daily.  She has been taking it only once daily.  Her CHA2DS2-VASc score is 1 and so for now she again chooses not to take anticoagulation.   She can go down to 81 mg ASA.   DYSLIPIDEMIA: She has not tolerated statins.  She is on a plant-based diet and her LDL is still 160.  I would like for her to have a coronary calcium score which will help guide therapy if she would agree to consider PCSK9 if she has elevated coronary calcium.  She has been intolerant of multiple statins over the years.   COVID EDUCATION:   She has had her vaccine.   Current medicines are reviewed at length with the patient today.  The patient does not have concerns regarding medicines.  The following changes have been made:  None  Labs/ tests ordered today include:   Orders Placed This Encounter  Procedures  . CT CARDIAC SCORING  . EKG 12-Lead     Disposition:   FU with me in one year.      Signed, Rollene Rotunda, MD  10/04/2019 8:55 AM    [facility]

## 2019-10-04 ENCOUNTER — Encounter: Payer: Self-pay | Admitting: Cardiology

## 2019-10-04 ENCOUNTER — Ambulatory Visit: Payer: Commercial Managed Care - PPO | Admitting: Cardiology

## 2019-10-04 ENCOUNTER — Other Ambulatory Visit: Payer: Self-pay

## 2019-10-04 VITALS — BP 104/72 | HR 57 | Ht 65.0 in | Wt 193.4 lb

## 2019-10-04 DIAGNOSIS — E785 Hyperlipidemia, unspecified: Secondary | ICD-10-CM

## 2019-10-04 DIAGNOSIS — Z7189 Other specified counseling: Secondary | ICD-10-CM

## 2019-10-04 DIAGNOSIS — I480 Paroxysmal atrial fibrillation: Secondary | ICD-10-CM

## 2019-10-04 MED ORDER — FLECAINIDE ACETATE 50 MG PO TABS: ORAL_TABLET | ORAL | 3 refills | Status: DC

## 2019-10-04 NOTE — Patient Instructions (Signed)
Medication Instructions:  No changes  *If you need a refill on your cardiac medications before your next appointment, please call your pharmacy*  Lab Work: None ordered this visit  Testing/Procedures: CT Cardiac Calcium Score  Follow-Up: At [facility], you and your health needs are our priority.  As part of our continuing mission to provide you with exceptional heart care, we have created designated Provider Care Teams.  These Care Teams include your primary Cardiologist (physician) and Advanced Practice Providers (APPs -  Physician Assistants and Nurse Practitioners) who all work together to provide you with the care you need, when you need it.  We recommend signing up for the patient portal called "MyChart".  Sign up information is provided on this After Visit Summary.  MyChart is used to connect with patients for Virtual Visits (Telemedicine).  Patients are able to view lab/test results, encounter notes, upcoming appointments, etc.  Non-urgent messages can be sent to your provider as well.   To learn more about what you can do with MyChart, go to [email or web address].    Your next appointment:   12 month(s)  The format for your next appointment:   In Person  Provider:   Rollene Rotunda, MD      Coronary CalciumScan A coronary calcium scan is an imaging test used to look for deposits of calcium and other fatty materials (plaques) in the inner lining of the blood vessels of the heart (coronary arteries). These deposits of calcium and plaques can partly clog and narrow the coronary arteries without producing any symptoms or warning signs. This puts a person at risk for a heart attack. This test can detect these deposits before symptoms develop. Tell a health care provider about:  Any allergies you have.  All medicines you are taking, including vitamins, herbs, eye drops, creams, and over-the-counter medicines.  Any problems you or family members have had with anesthetic  medicines.  Any blood disorders you have.  Any surgeries you have had.  Any medical conditions you have.  Whether you are pregnant or may be pregnant. What are the risks? Generally, this is a safe procedure. However, problems may occur, including:  Harm to a pregnant woman and her unborn baby. This test involves the use of radiation. Radiation exposure can be dangerous to a pregnant woman and her unborn baby. If you are pregnant, you generally should not have this procedure done.  Slight increase in the risk of cancer. This is because of the radiation involved in the test. What happens before the procedure? No preparation is needed for this procedure. What happens during the procedure?  You will undress and remove any jewelry around your neck or chest.  You will put on a hospital gown.  Sticky electrodes will be placed on your chest. The electrodes will be connected to an electrocardiogram (ECG) machine to record a tracing of the electrical activity of your heart.  A CT scanner will take pictures of your heart. During this time, you will be asked to lie still and hold your breath for 2-3 seconds while a picture of your heart is being taken. The procedure may vary among health care providers and hospitals. What happens after the procedure?  You can get dressed.  You can return to your normal activities.  It is up to you to get the results of your test. Ask your health care provider, or the department that is doing the test, when your results will be ready. Summary  A coronary calcium scan is  an imaging test used to look for deposits of calcium and other fatty materials (plaques) in the inner lining of the blood vessels of the heart (coronary arteries).  Generally, this is a safe procedure. Tell your health care provider if you are pregnant or may be pregnant.  No preparation is needed for this procedure.  A CT scanner will take pictures of your heart.  You can return to  your normal activities after the scan is done. This information is not intended to replace advice given to you by your health care provider. Make sure you discuss any questions you have with your health care provider. Document Released: 07/26/2007 Document Revised: 12/17/2015 Document Reviewed: 12/17/2015 Elsevier Interactive Patient Education  2017 [employer].

## 2019-10-06 ENCOUNTER — Ambulatory Visit (INDEPENDENT_AMBULATORY_CARE_PROVIDER_SITE_OTHER)
Admission: RE | Admit: 2019-10-06 | Discharge: 2019-10-06 | Disposition: A | Discharge status: Home / Self Care | Payer: Self-pay | Source: Ambulatory Visit | Attending: Cardiology | Admitting: Cardiology

## 2019-10-06 ENCOUNTER — Other Ambulatory Visit: Payer: Self-pay

## 2019-10-06 DIAGNOSIS — I480 Paroxysmal atrial fibrillation: Secondary | ICD-10-CM

## 2019-10-06 DIAGNOSIS — E785 Hyperlipidemia, unspecified: Secondary | ICD-10-CM

## 2019-11-02 ENCOUNTER — Other Ambulatory Visit: Payer: Self-pay | Admitting: Physician Assistant

## 2020-01-04 ENCOUNTER — Ambulatory Visit (INDEPENDENT_AMBULATORY_CARE_PROVIDER_SITE_OTHER): Payer: Commercial Managed Care - PPO | Admitting: Physician Assistant

## 2020-01-04 ENCOUNTER — Encounter: Payer: Self-pay | Admitting: Physician Assistant

## 2020-01-04 ENCOUNTER — Other Ambulatory Visit: Payer: Self-pay

## 2020-01-04 VITALS — BP 136/80 | HR 56 | Temp 97.9°F | Ht 65.0 in | Wt 197.0 lb

## 2020-01-04 DIAGNOSIS — M654 Radial styloid tenosynovitis [de Quervain]: Secondary | ICD-10-CM | POA: Diagnosis not present

## 2020-01-04 DIAGNOSIS — L719 Rosacea, unspecified: Secondary | ICD-10-CM

## 2020-01-04 MED ORDER — DOXYCYCLINE HYCLATE 100 MG PO TABS: 100.0000 mg | ORAL_TABLET | Freq: Two times a day (BID) | ORAL | 0 refills | Status: DC

## 2020-01-04 MED ORDER — METRONIDAZOLE 0.75 % EX CREA: TOPICAL_CREAM | Freq: Two times a day (BID) | CUTANEOUS | 0 refills | Status: DC

## 2020-01-04 NOTE — Progress Notes (Deleted)
I acted as a Neurosurgeon for [employer], PA-C Corky Mull, LPN    Subjective:    Kaitlin Cortez is a 64 y.o. female and is here for a comprehensive physical exam.   HPI  Health Maintenance Due  Topic Date Due  . Hepatitis C Screening  Never done  . COVID-19 Vaccine (1) Never done  . HIV Screening  Never done  . PAP SMEAR-Modifier  Never done  . COLONOSCOPY  Never done  . MAMMOGRAM  12/06/2017  . INFLUENZA VACCINE  09/11/2019    Acute Concerns:   Chronic Issues:   Health Maintenance: Immunizations -- *** Colonoscopy -- *** Mammogram -- *** PAP -- *** Bone Density -- *** Diet -- *** Caffeine intake -- *** Sleep habits -- *** Exercise -- *** Weight --    Mood -- *** Weight history: Wt Readings from Last 10 Encounters:  10/04/19 193 lb 6.4 oz (87.7 kg)  05/20/19 194 lb (88 kg)  04/21/19 194 lb 4 oz (88.1 kg)  04/20/19 194 lb 8 oz (88.2 kg)  12/03/18 184 lb (83.5 kg)  11/08/18 182 lb 4 oz (82.7 kg)  10/05/18 178 lb 6.4 oz (80.9 kg)  09/28/18 175 lb (79.4 kg)   There is no height or weight on file to calculate BMI. No LMP recorded. Patient is postmenopausal. Period characteristics: Alcohol use: Tobacco use:  Depression screen PHQ 2/9 09/28/2018  Decreased Interest 0  Down, Depressed, Hopeless 0  PHQ - 2 Score 0     Other providers/specialists: Patient Care Team: Jarold Motto, [place] as PCP - General (Physician Assistant)    PMHx, SurgHx, SocialHx, Medications, and Allergies were reviewed in the Visit Navigator and updated as appropriate.   Past Medical History:  Diagnosis Date  . GERD (gastroesophageal reflux disease)   . Hyperlipidemia   . Paroxysmal atrial fibrillation (HCC) 09/28/2018    Past Surgical History:  Procedure Laterality Date  . CARPAL TUNNEL RELEASE Bilateral 2015  . CHOLECYSTECTOMY  1998  . DIAGNOSTIC LAPAROSCOPY WITH REMOVAL OF ECTOPIC PREGNANCY Right 1996    Family History  Problem Relation Age of Onset  . AAA  (abdominal aortic aneurysm) Mother   . Dementia Father   . Colon polyps Father   . Throat cancer Father   . Esophageal cancer Neg Hx   . Colon cancer Neg Hx   . Pancreatic cancer Neg Hx   . Stomach cancer Neg Hx     Social History   Tobacco Use  . Smoking status: Never Smoker  . Smokeless tobacco: Never Used  Vaping Use  . Vaping Use: Never used  Substance Use Topics  . Alcohol use: Yes    Alcohol/week: 1.0 standard drink    Types: 1 Glasses of wine per week    Comment: occ  . Drug use: Never    Review of Systems:   ROS  Objective:   There were no vitals taken for this visit. There is no height or weight on file to calculate BMI.   General Appearance:    Alert, cooperative, no distress, appears stated age  Head:    Normocephalic, without obvious abnormality, atraumatic  Eyes:    PERRL, conjunctiva/corneas clear, EOM's intact, fundi    benign, both eyes  Ears:    Normal TM's and external ear canals, both ears  Nose:   Nares normal, septum midline, mucosa normal, no drainage    or sinus tenderness  Throat:   Lips, mucosa, and tongue normal; teeth and gums normal  Neck:   Supple, symmetrical, trachea midline, no adenopathy;    thyroid:  no enlargement/tenderness/nodules; no carotid   bruit or JVD  Back:     Symmetric, no curvature, ROM normal, no CVA tenderness  Lungs:     Clear to auscultation bilaterally, respirations unlabored  Chest Wall:    No tenderness or deformity   Heart:    Regular rate and rhythm, S1 and S2 normal, no murmur, rub or gallop  Breast Exam:    No tenderness, masses, or nipple abnormality  Abdomen:     Soft, non-tender, bowel sounds active all four quadrants,    no masses, no organomegaly  Genitalia:    Normal female without lesion, discharge or tenderness  Extremities:   Extremities normal, atraumatic, no cyanosis or edema  Pulses:   2+ and symmetric all extremities  Skin:   Skin color, texture, turgor normal, no rashes or lesions  Lymph  nodes:   Cervical, supraclavicular, and axillary nodes normal  Neurologic:   CNII-XII intact, normal strength, sensation and reflexes    throughout   Results for orders placed or performed in visit on 05/18/19  SARS Coronavirus 2 (TAT 6-24 hrs)  Result Value Ref Range   SARS Coronavirus 2 RESULT: NEGATIVE     Assessment/Plan:   There are no diagnoses linked to this encounter.   Well Adult Exam: Labs ordered: {Yes/No:18319::"Yes"}. Patient counseling was done. See below for items discussed. Discussed the patient's BMI.  The {BMI plan (MU NQF measure 421):19504} Follow up {follow-up interval:13814}. Breast cancer screening: ***. Cervical cancer screening: ***   Patient Counseling: [x]    Nutrition: Stressed importance of moderation in sodium/caffeine intake, saturated fat and cholesterol, caloric balance, sufficient intake of fresh fruits, vegetables, fiber, calcium, iron, and 1 mg of folate supplement per day (for females capable of pregnancy).  [x]    Stressed the importance of regular exercise.   [x]    Substance Abuse: Discussed cessation/primary prevention of tobacco, alcohol, or other drug use; driving or other dangerous activities under the influence; availability of treatment for abuse.   [x]    Injury prevention: Discussed safety belts, safety helmets, smoke detector, smoking near bedding or upholstery.   [x]    Sexuality: Discussed sexually transmitted diseases, partner selection, use of condoms, avoidance of unintended pregnancy  and contraceptive alternatives.  [x]    Dental health: Discussed importance of regular tooth brushing, flossing, and dental visits.  [x]    Health maintenance and immunizations reviewed. Please refer to Health maintenance section.   ***  , PA-C [facility]

## 2020-01-04 NOTE — Patient Instructions (Addendum)
It was great to see you!  For rosacea, let's start oral doxycycline and topical metronidazole.  See handout to explain what's going on with your wrist -- DeQuervain's Tenosynovitis.  Voltaren Gel is available over the counter.   You are to apply this gel to your injured body part twice daily (morning and evening).   A little goes a long way so you can use about a pea-sized amount for each area.  ? Spread this small amount over the area into a thin film and let it dry.  ? Be sure that you do not rub the gel into your skin for more than 10 or 15 seconds otherwise it can irritate you skin.   ? Once you apply the gel, please do not put any other lotion or clothing in contact with that area for 30 minutes to allow the gel to absorb into your skin.   Some people are sensitive to the medication and can develop a sunburn-like rash.  If you have only mild symptoms it is okay to continue to use the medication but if you have any breakdown of your skin you should discontinue its use and please let us know.

## 2020-01-04 NOTE — Progress Notes (Signed)
Kaitlin Cortez is a 64 y.o. female here for a new problem.  I acted as a Neurosurgeon for [employer], PA-C Kaitlin Mull, LPN   History of Present Illness:   Chief Complaint  Patient presents with  . Rash  . Wrist Pain    HPI   Rosacea Pt is having an outbreak on face x 1 week. Has hx of rosacea, prescribed oral abx and topical abx in the past. Pt has tried some Rosacea cream OTC but not helping. She is unable to tell specifically what is causing a flare of her symptoms. Denies any open lesions, fever, pain. Uses sunscreen diligently. Has great mask hygiene.   Wrist pain Pt c/o pain left wrist x 2 weeks, she is having shooting pains up her arm and into thumb. Has been using Motrin and Tylenol with relief. She is R-handed. She does a significant amount of typing with work. Recently been painting. Has hx of carpal tunnel surgery in the remote past.   Past Medical History:  Diagnosis Date  . GERD (gastroesophageal reflux disease)   . Hyperlipidemia   . Paroxysmal atrial fibrillation (HCC) 09/28/2018     Social History   Tobacco Use  . Smoking status: Never Smoker  . Smokeless tobacco: Never Used  Vaping Use  . Vaping Use: Never used  Substance Use Topics  . Alcohol use: Yes    Alcohol/week: 1.0 standard drink    Types: 1 Glasses of wine per week    Comment: occ  . Drug use: Never    Past Surgical History:  Procedure Laterality Date  . CARPAL TUNNEL RELEASE Bilateral 2015  . CHOLECYSTECTOMY  1998  . DIAGNOSTIC LAPAROSCOPY WITH REMOVAL OF ECTOPIC PREGNANCY Right 1996    Family History  Problem Relation Age of Onset  . AAA (abdominal aortic aneurysm) Mother   . Dementia Father   . Colon polyps Father   . Throat cancer Father   . Esophageal cancer Neg Hx   . Colon cancer Neg Hx   . Pancreatic cancer Neg Hx   . Stomach cancer Neg Hx     Allergies  Allergen Reactions  . Statins Other (See Comments)    myalgias  . Versed [Midazolam]     Makes her throw up     Current Medications:   Current Outpatient Medications:  .  aspirin EC 81 MG tablet, Take 81 mg by mouth daily. Swallow whole., Disp: , Rfl:  .  diltiazem (CARDIZEM) 30 MG tablet, Take 1 Tablet Every 4 Hours As Needed For HR >100, Disp: 45 tablet, Rfl: 2 .  flecainide (TAMBOCOR) 50 MG tablet, TAKE 1 TABLET(50 MG) BY MOUTH TWICE DAILY, Disp: 180 tablet, Rfl: 3 .  Multiple Vitamin (MULTIVITAMIN) tablet, Take 1 tablet by mouth daily., Disp: , Rfl:  .  pantoprazole (PROTONIX) 40 MG tablet, TAKE 1 TABLET(40 MG) BY MOUTH AT BEDTIME, Disp: 90 tablet, Rfl: 0 .  doxycycline (VIBRA-TABS) 100 MG tablet, Take 1 tablet (100 mg total) by mouth 2 (two) times daily., Disp: 20 tablet, Rfl: 0 .  metroNIDAZOLE (METROCREAM) 0.75 % cream, Apply topically 2 (two) times daily., Disp: 45 g, Rfl: 0   Review of Systems:   ROS Negative unless otherwise specified per HPI.  Vitals:   Vitals:   01/04/20 0818  BP: 136/80  Pulse: (!) 56  Temp: 97.9 F (36.6 C)  TempSrc: Temporal  SpO2: 96%  Weight: 197 lb (89.4 kg)  Height: 5\' 5"  (1.651 m)     Body mass  index is 32.78 kg/m.  Physical Exam:   Physical Exam Vitals and nursing note reviewed.  Constitutional:      General: She is not in acute distress.    Appearance: She is well-developed. She is not ill-appearing or toxic-appearing.  Cardiovascular:     Rate and Rhythm: Normal rate and regular rhythm.     Pulses: Normal pulses.     Heart sounds: Normal heart sounds, S1 normal and S2 normal.     Comments: No LE edema Pulmonary:     Effort: Pulmonary effort is normal.     Breath sounds: Normal breath sounds.  Musculoskeletal:     Comments: L wrist: Positive finkelstein's test TTP to radial aspect of wrist  Skin:    General: Skin is warm and dry.     Comments: Erythematous patches on bilateral cheeks and forehead  Neurological:     Mental Status: She is alert.     GCS: GCS eye subscore is 4. GCS verbal subscore is 5. GCS motor subscore is 6.      Comments: Grip strength 5/5  Psychiatric:        Speech: Speech normal.        Behavior: Behavior normal. Behavior is cooperative.     Assessment and Plan:   [name] was seen today for rash and wrist pain.  Diagnoses and all orders for this visit:  Rosacea Trial oral doxycycline and topical metronidazole ointment. Follow-up if lack of improvement.  De Quervain's tenosynovitis No red flags on exam. Recommend wrist splint, ice, Voltaren gel and sparing use of oral ibuprofen due to renal insufficiency. Handout provided.  Other orders -     doxycycline (VIBRA-TABS) 100 MG tablet; Take 1 tablet (100 mg total) by mouth 2 (two) times daily. -     metroNIDAZOLE (METROCREAM) 0.75 % cream; Apply topically 2 (two) times daily.   CMA or LPN served as scribe during this visit. History, Physical, and Plan performed by medical provider. The above documentation has been reviewed and is accurate and complete.  Jarold Motto, PA-C

## 2020-01-19 LAB — HM MAMMOGRAPHY

## 2020-01-25 ENCOUNTER — Encounter: Payer: Self-pay | Admitting: Physician Assistant

## 2020-01-25 ENCOUNTER — Other Ambulatory Visit: Payer: Self-pay

## 2020-01-25 ENCOUNTER — Ambulatory Visit (INDEPENDENT_AMBULATORY_CARE_PROVIDER_SITE_OTHER): Payer: Commercial Managed Care - PPO | Admitting: Physician Assistant

## 2020-01-25 ENCOUNTER — Other Ambulatory Visit (HOSPITAL_COMMUNITY)
Admission: RE | Admit: 2020-01-25 | Discharge: 2020-01-25 | Disposition: A | Discharge status: Home / Self Care | Payer: Commercial Managed Care - PPO | Source: Ambulatory Visit | Attending: Physician Assistant | Admitting: Physician Assistant

## 2020-01-25 VITALS — BP 102/60 | HR 60 | Temp 98.1°F | Ht 64.25 in | Wt 198.2 lb

## 2020-01-25 DIAGNOSIS — I480 Paroxysmal atrial fibrillation: Secondary | ICD-10-CM

## 2020-01-25 DIAGNOSIS — Z1211 Encounter for screening for malignant neoplasm of colon: Secondary | ICD-10-CM

## 2020-01-25 DIAGNOSIS — Z114 Encounter for screening for human immunodeficiency virus [HIV]: Secondary | ICD-10-CM | POA: Diagnosis not present

## 2020-01-25 DIAGNOSIS — Z0000 Encounter for general adult medical examination without abnormal findings: Secondary | ICD-10-CM | POA: Diagnosis not present

## 2020-01-25 DIAGNOSIS — Z124 Encounter for screening for malignant neoplasm of cervix: Secondary | ICD-10-CM | POA: Diagnosis not present

## 2020-01-25 DIAGNOSIS — E785 Hyperlipidemia, unspecified: Secondary | ICD-10-CM

## 2020-01-25 DIAGNOSIS — Z1159 Encounter for screening for other viral diseases: Secondary | ICD-10-CM

## 2020-01-25 DIAGNOSIS — E669 Obesity, unspecified: Secondary | ICD-10-CM

## 2020-01-25 NOTE — Patient Instructions (Addendum)
It was great to see you!  Please make an appointment with the lab on your way out. I would like for you to return for lab work within 1-2 weeks. After midnight on the day of the lab draw, please do not eat anything. You may have water, black coffee, unsweetened tea.   Take care,  [facility] Maintenance, Female Adopting a healthy lifestyle and getting preventive care are important in promoting health and wellness. Ask your health care provider about:  The right schedule for you to have regular tests and exams.  Things you can do on your own to prevent diseases and keep yourself healthy. What should I know about diet, weight, and exercise? Eat a healthy diet   Eat a diet that includes plenty of vegetables, fruits, low-fat dairy products, and lean protein.  Do not eat a lot of foods that are high in solid fats, added sugars, or sodium. Maintain a healthy weight Body mass index (BMI) is used to identify weight problems. It estimates body fat based on height and weight. Your health care provider can help determine your BMI and help you achieve or maintain a healthy weight. Get regular exercise Get regular exercise. This is one of the most important things you can do for your health. Most adults should:  Exercise for at least 150 minutes each week. The exercise should increase your heart rate and make you sweat (moderate-intensity exercise).  Do strengthening exercises at least twice a week. This is in addition to the moderate-intensity exercise.  Spend less time sitting. Even light physical activity can be beneficial. Watch cholesterol and blood lipids Have your blood tested for lipids and cholesterol at 63 years of age, then have this test every 5 years. Have your cholesterol levels checked more often if:  Your lipid or cholesterol levels are high.  You are older than 63 years of age.  You are at high risk for heart disease. What should I know about cancer  screening? Depending on your health history and family history, you may need to have cancer screening at various ages. This may include screening for:  Breast cancer.  Cervical cancer.  Colorectal cancer.  Skin cancer.  Lung cancer. What should I know about heart disease, diabetes, and high blood pressure? Blood pressure and heart disease  High blood pressure causes heart disease and increases the risk of stroke. This is more likely to develop in people who have high blood pressure readings, are of African descent, or are overweight.  Have your blood pressure checked: ? Every 3-5 years if you are 71-72 years of age. ? Every year if you are 31 years old or older. Diabetes Have regular diabetes screenings. This checks your fasting blood sugar level. Have the screening done:  Once every three years after age 74 if you are at a normal weight and have a low risk for diabetes.  More often and at a younger age if you are overweight or have a high risk for diabetes. What should I know about preventing infection? Hepatitis B If you have a higher risk for hepatitis B, you should be screened for this virus. Talk with your health care provider to find out if you are at risk for hepatitis B infection. Hepatitis C Testing is recommended for:  Everyone born from [birth year] through 1964.  Anyone with known risk factors for hepatitis C. Sexually transmitted infections (STIs)  Get screened for STIs, including gonorrhea and chlamydia, if: ? You are sexually  active and are younger than 63 years of age. ? You are older than 63 years of age and your health care provider tells you that you are at risk for this type of infection. ? Your sexual activity has changed since you were last screened, and you are at increased risk for chlamydia or gonorrhea. Ask your health care provider if you are at risk.  Ask your health care provider about whether you are at high risk for HIV. Your health care provider may  recommend a prescription medicine to help prevent HIV infection. If you choose to take medicine to prevent HIV, you should first get tested for HIV. You should then be tested every 3 months for as long as you are taking the medicine. Pregnancy  If you are about to stop having your period (premenopausal) and you may become pregnant, seek counseling before you get pregnant.  Take 400 to 800 micrograms (mcg) of folic acid every day if you become pregnant.  Ask for birth control (contraception) if you want to prevent pregnancy. Osteoporosis and menopause Osteoporosis is a disease in which the bones lose minerals and strength with aging. This can result in bone fractures. If you are 25 years old or older, or if you are at risk for osteoporosis and fractures, ask your health care provider if you should:  Be screened for bone loss.  Take a calcium or vitamin D supplement to lower your risk of fractures.  Be given hormone replacement therapy (HRT) to treat symptoms of menopause. Follow these instructions at home: Lifestyle  Do not use any products that contain nicotine or tobacco, such as cigarettes, e-cigarettes, and chewing tobacco. If you need help quitting, ask your health care provider.  Do not use street drugs.  Do not share needles.  Ask your health care provider for help if you need support or information about quitting drugs. Alcohol use  Do not drink alcohol if: ? Your health care provider tells you not to drink. ? You are pregnant, may be pregnant, or are planning to become pregnant.  If you drink alcohol: ? Limit how much you use to 0-1 drink a day. ? Limit intake if you are breastfeeding.  Be aware of how much alcohol is in your drink. In the [place] one drink equals one 12 oz bottle of beer (355 mL), one 5 oz glass of wine (148 mL), or one 1 oz glass of hard liquor (44 mL). General instructions  Schedule regular health, dental, and eye exams.  Stay current with your  vaccines.  Tell your health care provider if: ? You often feel depressed. ? You have ever been abused or do not feel safe at home. Summary  Adopting a healthy lifestyle and getting preventive care are important in promoting health and wellness.  Follow your health care provider's instructions about healthy diet, exercising, and getting tested or screened for diseases.  Follow your health care provider's instructions on monitoring your cholesterol and blood pressure. This information is not intended to replace advice given to you by your health care provider. Make sure you discuss any questions you have with your health care provider. Document Revised: 01/20/2018 Document Reviewed: 01/20/2018 Elsevier Patient Education  2020 [employer].

## 2020-01-25 NOTE — Progress Notes (Signed)
I acted as a Neurosurgeon for [employer], PA-C Kaitlin Mull, LPN    Subjective:    Kaitlin Cortez is a 64 y.o. female and is here for a comprehensive physical exam.   HPI  Health Maintenance Due  Topic Date Due  . DEXA SCAN  Never done  . Hepatitis C Screening  Never done  . COLONOSCOPY  Never done  . PAP SMEAR-Modifier  Never done  . MAMMOGRAM  12/06/2017  . COVID-19 Vaccine (3 - Booster for Pfizer series) 12/06/2019    Acute Concerns: None  Chronic Issues: PAF -- sees cardiology. Well controlled per patient. Takes cardizem 30 mg prn, flecainide 50 mg BID. HLD -- managed by cardiology. She is statin-intolerant and was considering PCSK9. She had cardiac scoring in August and was normal.  Health Maintenance: Immunizations -- UTD Colonoscopy -- done 2016, need repeat 5 yrs, due to hx family polyps. Mammogram -- had done on 12/10, waiting on report PAP -- overdue, will do today. No hx of abnormal pap smears Bone Density -- never Diet -- vegan Sleep habits -- good Exercise -- not exercising regularly due to work; occasional hiking Weight -- Weight: 198 lb 4 oz (89.9 kg)  Mood -- no major concerns Weight history: Wt Readings from Last 10 Encounters:  01/25/20 198 lb 4 oz (89.9 kg)  01/04/20 197 lb (89.4 kg)  10/04/19 193 lb 6.4 oz (87.7 kg)  05/20/19 194 lb (88 kg)  04/21/19 194 lb 4 oz (88.1 kg)  04/20/19 194 lb 8 oz (88.2 kg)  12/03/18 184 lb (83.5 kg)  11/08/18 182 lb 4 oz (82.7 kg)  10/05/18 178 lb 6.4 oz (80.9 kg)  09/28/18 175 lb (79.4 kg)   Body mass index is 33.77 kg/m. No LMP recorded. Patient is postmenopausal. Period characteristics: Alcohol use: 1-2 glasses wine per week Tobacco use: none  Depression screen PHQ 2/9 01/25/2020  Decreased Interest 0  Down, Depressed, Hopeless 0  PHQ - 2 Score 0     Other providers/specialists: Patient Care Team: Kaitlin Cortez, [place] as PCP - General (Physician Assistant)    PMHx, SurgHx, SocialHx,  Medications, and Allergies were reviewed in the Visit Navigator and updated as appropriate.   Past Medical History:  Diagnosis Date  . GERD (gastroesophageal reflux disease)   . Hyperlipidemia   . Paroxysmal atrial fibrillation (HCC) 09/28/2018     Past Surgical History:  Procedure Laterality Date  . CARPAL TUNNEL RELEASE Bilateral 2015  . CHOLECYSTECTOMY  1998  . DIAGNOSTIC LAPAROSCOPY WITH REMOVAL OF ECTOPIC PREGNANCY Right 1996     Family History  Problem Relation Age of Onset  . AAA (abdominal aortic aneurysm) Mother   . Dementia Father   . Colon polyps Father   . Throat cancer Father   . Esophageal cancer Neg Hx   . Colon cancer Neg Hx   . Pancreatic cancer Neg Hx   . Stomach cancer Neg Hx     Social History   Tobacco Use  . Smoking status: Never Smoker  . Smokeless tobacco: Never Used  Vaping Use  . Vaping Use: Never used  Substance Use Topics  . Alcohol use: Yes    Alcohol/week: 1.0 standard drink    Types: 1 Glasses of wine per week    Comment: occ  . Drug use: Never    Review of Systems:   Review of Systems  Constitutional: Negative for chills, fever, malaise/fatigue and weight loss.  HENT: Negative for hearing loss, sinus pain and sore  throat.   Eyes: Negative for blurred vision.  Respiratory: Negative for cough and shortness of breath.   Cardiovascular: Negative for chest pain, palpitations and leg swelling.  Gastrointestinal: Negative for abdominal pain, constipation, diarrhea, heartburn, nausea and vomiting.  Genitourinary: Negative for dysuria, frequency and urgency.  Musculoskeletal: Negative for back pain, myalgias and neck pain.  Skin: Negative for itching and rash.  Neurological: Negative for dizziness, tingling, seizures, loss of consciousness and headaches.  Endo/Heme/Allergies: Negative for polydipsia.  Psychiatric/Behavioral: Negative for depression. The patient is not nervous/anxious.   All other systems reviewed and are  negative.   Objective:    Body mass index is 33.77 kg/m.   General Appearance:    Alert, cooperative, no distress, appears stated age  Head:    Normocephalic, without obvious abnormality, atraumatic  Eyes:    PERRL, conjunctiva/corneas clear, EOM's intact, fundi    benign, both eyes  Ears:    Normal TM's and external ear canals, both ears  Nose:   Nares normal, septum midline, mucosa normal, no drainage    or sinus tenderness  Throat:   Lips, mucosa, and tongue normal; teeth and gums normal  Neck:   Supple, symmetrical, trachea midline, no adenopathy;    thyroid:  no enlargement/tenderness/nodules; no carotid   bruit or JVD  Back:     Symmetric, no curvature, ROM normal, no CVA tenderness  Lungs:     Clear to auscultation bilaterally, respirations unlabored  Chest Wall:    No tenderness or deformity   Heart:    Regular rate and rhythm, S1 and S2 normal, no murmur, rub or gallop  Breast Exam:    Deferred  Abdomen:     Soft, non-tender, bowel sounds active all four quadrants,    no masses, no organomegaly  Genitalia:    Normal female without lesion, discharge or tenderness  Extremities:   Extremities normal, atraumatic, no cyanosis or edema  Pulses:   2+ and symmetric all extremities  Skin:   Skin color, texture, turgor normal, no rashes or lesions  Lymph nodes:   Cervical, supraclavicular, and axillary nodes normal  Neurologic:   CNII-XII intact, normal strength, sensation and reflexes    throughout     Assessment/Plan:   Kaitlin Cortez was seen today for annual exam.  Diagnoses and all orders for this visit:  Routine physical examination Today patient counseled on age appropriate routine health concerns for screening and prevention, each reviewed and up to date or declined. Immunizations reviewed and up to date or declined. Labs ordered and reviewed. Risk factors for depression reviewed and negative. Hearing function and visual acuity are intact. ADLs screened and addressed as  needed. Functional ability and level of safety reviewed and appropriate. Education, counseling and referrals performed based on assessed risks today. Patient provided with a copy of personalized plan for preventive services.  Paroxysmal atrial fibrillation (HCC) Management per cardiology. -     CBC with Differential/Platelet; Future -     Comprehensive metabolic panel; Future  Pap smear for cervical cancer screening -     Cytology - PAP  Screening for HIV (human immunodeficiency virus) -     HIV Antibody (routine testing w rflx); Future  Encounter for screening for other viral diseases -     Hepatitis C antibody; Future  Hyperlipidemia, unspecified hyperlipidemia type Management per cardiology.  Obesity Recommend working on diet and exercise as able.   Well Adult Exam: Labs ordered: Yes. Patient counseling was done. See below for items discussed. Discussed  the patient's BMI.  The BMI is not in the acceptable range; BMI management plan is completed Follow up in one year. Breast cancer screening: UTD. Cervical cancer screening: performed today.   Patient Counseling: [x]    Nutrition: Stressed importance of moderation in sodium/caffeine intake, saturated fat and cholesterol, caloric balance, sufficient intake of fresh fruits, vegetables, fiber, calcium, iron, and 1 mg of folate supplement per day (for females capable of pregnancy).  [x]    Stressed the importance of regular exercise.   [x]    Substance Abuse: Discussed cessation/primary prevention of tobacco, alcohol, or other drug use; driving or other dangerous activities under the influence; availability of treatment for abuse.   [x]    Injury prevention: Discussed safety belts, safety helmets, smoke detector, smoking near bedding or upholstery.   [x]    Sexuality: Discussed sexually transmitted diseases, partner selection, use of condoms, avoidance of unintended pregnancy  and contraceptive alternatives.  [x]    Dental health: Discussed  importance of regular tooth brushing, flossing, and dental visits.  [x]    Health maintenance and immunizations reviewed. Please refer to Health maintenance section.   CMA or LPN served as scribe during this visit. History, Physical, and Plan performed by medical provider. The above documentation has been reviewed and is accurate and complete.   , PA-C [facility]

## 2020-01-26 ENCOUNTER — Other Ambulatory Visit: Payer: Commercial Managed Care - PPO

## 2020-01-26 DIAGNOSIS — I480 Paroxysmal atrial fibrillation: Secondary | ICD-10-CM

## 2020-01-26 DIAGNOSIS — Z1159 Encounter for screening for other viral diseases: Secondary | ICD-10-CM

## 2020-01-26 DIAGNOSIS — Z114 Encounter for screening for human immunodeficiency virus [HIV]: Secondary | ICD-10-CM

## 2020-01-26 NOTE — Addendum Note (Signed)
Addended by: Haynes Bast on: 01/26/2020 06:04 AM   Modules accepted: Orders

## 2020-01-27 LAB — CBC WITH DIFFERENTIAL/PLATELET
Absolute Monocytes: 394 cells/uL (ref 200–950)
Basophils Absolute: 38 cells/uL (ref 0–200)
Basophils Relative: 0.8 %
Eosinophils Absolute: 48 cells/uL (ref 15–500)
Eosinophils Relative: 1 %
HCT: 43 % (ref 35.0–45.0)
Hemoglobin: 14.5 g/dL (ref 11.7–15.5)
Lymphs Abs: 1795 cells/uL (ref 850–3900)
MCH: 31.3 pg (ref 27.0–33.0)
MCHC: 33.7 g/dL (ref 32.0–36.0)
MCV: 92.9 fL (ref 80.0–100.0)
MPV: 11.3 fL (ref 7.5–12.5)
Monocytes Relative: 8.2 %
Neutro Abs: 2525 cells/uL (ref 1500–7800)
Neutrophils Relative %: 52.6 %
Platelets: 218 10*3/uL (ref 140–400)
RBC: 4.63 10*6/uL (ref 3.80–5.10)
RDW: 11.8 % (ref 11.0–15.0)
Total Lymphocyte: 37.4 %
WBC: 4.8 10*3/uL (ref 3.8–10.8)

## 2020-01-27 LAB — COMPREHENSIVE METABOLIC PANEL
AG Ratio: 2.4 (calc) (ref 1.0–2.5)
ALT: 13 U/L (ref 6–29)
AST: 19 U/L (ref 10–35)
Albumin: 4.5 g/dL (ref 3.6–5.1)
Alkaline phosphatase (APISO): 84 U/L (ref 37–153)
BUN/Creatinine Ratio: 15 (calc) (ref 6–22)
BUN: 15 mg/dL (ref 7–25)
CO2: 29 mmol/L (ref 20–32)
Calcium: 9.9 mg/dL (ref 8.6–10.4)
Chloride: 102 mmol/L (ref 98–110)
Creat: 1.02 mg/dL — ABNORMAL HIGH (ref 0.50–0.99)
Globulin: 1.9 g/dL (calc) (ref 1.9–3.7)
Glucose, Bld: 82 mg/dL (ref 65–99)
Potassium: 5 mmol/L (ref 3.5–5.3)
Sodium: 139 mmol/L (ref 135–146)
Total Bilirubin: 0.7 mg/dL (ref 0.2–1.2)
Total Protein: 6.4 g/dL (ref 6.1–8.1)

## 2020-01-27 LAB — HEPATITIS C ANTIBODY
Hepatitis C Ab: NONREACTIVE
SIGNAL TO CUT-OFF: 0.01 (ref <1.00)

## 2020-01-27 LAB — HIV ANTIBODY (ROUTINE TESTING W REFLEX): HIV 1&2 Ab, 4th Generation: NONREACTIVE

## 2020-01-30 LAB — CYTOLOGY - PAP
Adequacy: ABSENT
Comment: NEGATIVE
Diagnosis: NEGATIVE
High risk HPV: NEGATIVE

## 2020-01-31 ENCOUNTER — Encounter: Payer: Self-pay | Admitting: Physician Assistant

## 2020-02-09 ENCOUNTER — Other Ambulatory Visit: Payer: Self-pay | Admitting: Physician Assistant

## 2020-02-19 LAB — COLOGUARD: Cologuard: NEGATIVE

## 2020-03-06 ENCOUNTER — Encounter: Payer: Self-pay | Admitting: Physician Assistant

## 2020-03-09 ENCOUNTER — Encounter: Payer: Self-pay | Admitting: Physician Assistant

## 2020-03-09 ENCOUNTER — Other Ambulatory Visit: Payer: Self-pay | Admitting: Physician Assistant

## 2020-03-09 DIAGNOSIS — M654 Radial styloid tenosynovitis [de Quervain]: Secondary | ICD-10-CM

## 2020-03-13 NOTE — Progress Notes (Signed)
   Subjective:    I'm seeing this patient as a consultation for:  Kaitlin Cortez, [place]. Note will be routed back to referring provider/PCP.  CC: L wrist pain  HPI: Pt is a 65 y/o RHD female presenting w/ c/o L wrist pain since Thanksgiving .  She locates her pain to her L radial wrist and forearm. She has tried some rest and a conventional wrist brace which has not helped much.  She is right-hand dominant.  She cannot think of any activity that might have caused it.  She works as a Quarry manager.  Swelling: No Aggravating factors: L wrist supination/pronation; L wrist radial deviation; lifting heavier items Treatments tried: wrist brace; cold; Voltaren gel; Tylenol  Past medical history, Surgical history, Family history, Social history, Allergies, and medications have been entered into the medical record, reviewed.   Review of Systems: No new headache, visual changes, nausea, vomiting, diarrhea, constipation, dizziness, abdominal pain, skin rash, fevers, chills, night sweats, weight loss, swollen lymph nodes, body aches, joint swelling, muscle aches, chest pain, shortness of breath, mood changes, visual or auditory hallucinations.   Objective:    Vitals:   03/14/20 0756  BP: 112/80  Pulse: (!) 55  SpO2: 98%   General: Well Developed, well nourished, and in no acute distress.  Neuro/Psych: Alert and oriented x3, extra-ocular muscles intact, able to move all 4 extremities, sensation grossly intact. Skin: Warm and dry, no rashes noted.  Respiratory: Not using accessory muscles, speaking in full sentences, trachea midline.  Cardiovascular: Pulses palpable, no extremity edema. Abdomen: Does not appear distended. MSK: Left wrist normal-appearing Tender palpation radial styloid. Normal wrist motion and strength. Positive Finkelstein's test.  Lab and Radiology Results  Diagnostic Limited MSK Ultrasound of: Left wrist Dorsal first wrist compartment enlarged tendon and tendon sheath  surrounded by hypoechoic fluid consistent with de Quervain's tenosynovitis Impression: Tommi Rumps Quervain's tenosynovitis   Impression and Recommendations:    Assessment and Plan: 65 y.o. female with left wrist de Quervain's tenosynovitis.  Patient has had this ongoing since late November however has not had effective conservative management yet.  Plan for thumb spica splint, Voltaren gel, and referral to hand PT.  Check back in about a month if not improving will consider steroid injection.  PDMP not reviewed this encounter. Orders Placed This Encounter  Procedures  . [place] LIMITED JOINT SPACE STRUCTURES UP LEFT(NO LINKED CHARGES)    Order Specific Question:   Reason for Exam (SYMPTOM  OR DIAGNOSIS REQUIRED)    Answer:   L wrist pain    Order Specific Question:   Preferred imaging location?    Answer:   Adult nurse [facility]  . Ambulatory referral to Physical Therapy    Referral Priority:   Routine    Referral Type:   Physical Medicine    Referral Reason:   Specialty Services Required    Requested Specialty:   Physical Therapy    Number of Visits Requested:   1   No orders of the defined types were placed in this encounter.   Discussed warning signs or symptoms. Please see discharge instructions. Patient expresses understanding.   The above documentation has been reviewed and is accurate and complete Clementeen Graham, M.D.

## 2020-03-14 ENCOUNTER — Other Ambulatory Visit: Payer: Self-pay

## 2020-03-14 ENCOUNTER — Encounter: Payer: Self-pay | Admitting: Family Medicine

## 2020-03-14 ENCOUNTER — Ambulatory Visit (INDEPENDENT_AMBULATORY_CARE_PROVIDER_SITE_OTHER): Payer: Commercial Managed Care - PPO | Admitting: Family Medicine

## 2020-03-14 ENCOUNTER — Ambulatory Visit: Payer: Self-pay

## 2020-03-14 VITALS — BP 112/80 | HR 55 | Ht 64.25 in | Wt 201.6 lb

## 2020-03-14 DIAGNOSIS — M25532 Pain in left wrist: Secondary | ICD-10-CM

## 2020-03-14 DIAGNOSIS — M654 Radial styloid tenosynovitis [de Quervain]: Secondary | ICD-10-CM

## 2020-03-14 NOTE — Patient Instructions (Signed)
Thank you for coming in today.  Please use voltaren gel up to 4x daily for pain as needed.   I've referred you to Physical Therapy.  Let us know if you don't hear from them in one week.  Use a Thumb Spica Splint with activity and as needed.   Recheck in about 1 month.   If not better I can do an injection at any time.    De Quervain's Tenosynovitis  De Quervain's tenosynovitis is a condition that causes inflammation of the tendon on the thumb side of the wrist. Tendons are cords of tissue that connect bones to muscles. The tendons in the hand pass through a tunnel called a sheath. A slippery layer of tissue (synovium) lets the tendons move smoothly in the sheath. With de Quervain's tenosynovitis, the sheath swells or thickens, causing friction and pain. The condition is also called de Quervain's disease and de Quervain's syndrome. It occurs most often in women who are 64-64 years old. What are the causes? The exact cause of this condition is not known. It may be associated with overuse of the hand and wrist. What increases the risk? You are more likely to develop this condition if you:  Use your hands far more than normal, especially if you repeat certain movements that involve twisting your hand or using a tight grip.  Are pregnant.  Are a middle-aged woman.  Have rheumatoid arthritis.  Have diabetes. What are the signs or symptoms? The main symptom of this condition is pain on the thumb side of the wrist. The pain may get worse when you grasp something or turn your wrist. Other symptoms may include:  Pain that extends up the forearm.  Swelling of your wrist and hand.  Trouble moving the thumb and wrist.  A sensation of snapping in the wrist.  A bump filled with fluid (cyst) in the area of the pain. How is this diagnosed? This condition may be diagnosed based on:  Your symptoms and medical history.  A physical exam. During the exam, your health care provider may do a  simple test Lourena Simmonds test) that involves pulling your thumb and wrist to see if this causes pain. You may also need to have an X-ray or ultrasound. How is this treated? Treatment for this condition may include:  Avoiding any activity that causes pain and swelling.  Taking medicines. Anti-inflammatory medicines and corticosteroid injections may be used to reduce inflammation and relieve pain.  Wearing a splint.  Having surgery. This may be needed if other treatments do not work. Once the pain and swelling have gone down, you may start:  Physical therapy. This includes exercises to improve movement and strength in your wrist and thumb.  Occupational therapy. This includes adjusting how you move your wrist. Follow these instructions at home: If you have a splint:  Wear the splint as told by your health care provider. Remove it only as told by your health care provider.  Loosen the splint if your fingers tingle, become numb, or turn cold and blue.  Keep the splint clean.  If the splint is not waterproof: ? Do not let it get wet. ? Cover it with a watertight covering when you take a bath or a shower. Managing pain, stiffness, and swelling  Avoid movements and activities that cause pain and swelling in the wrist area.  If directed, put ice on the painful area. This may be helpful after doing activities that involve the sore wrist. To do this: ?  Put ice in a plastic bag. ? Place a towel between your skin and the bag. ? Leave the ice on for 20 minutes, 2-3 times a day. ? Remove the ice if your skin turns bright red. This is very important. If you cannot feel pain, heat, or cold, you have a greater risk of damage to the area.  Move your fingers often to reduce stiffness and swelling.  Raise (elevate) the injured area above the level of your heart while you are sitting or lying down.   General instructions  Return to your normal activities as told by your health care provider.  Ask your health care provider what activities are safe for you.  Take over-the-counter and prescription medicines only as told by your health care provider.  Keep all follow-up visits. This is important. Contact a health care provider if:  Your pain medicine does not help.  Your pain gets worse.  You develop new symptoms. Summary  De Quervain's tenosynovitis is a condition that causes inflammation of the tendon on the thumb side of the wrist.  The condition occurs most often in women who are 64-64 years old.  The exact cause of this condition is not known. It may be associated with overuse of the hand and wrist.  Treatment starts with avoiding activity that causes pain or swelling in the wrist area. Other treatments may include wearing a splint and taking medicine. Sometimes, surgery is needed. This information is not intended to replace advice given to you by your health care provider. Make sure you discuss any questions you have with your health care provider. Document Revised: 05/11/2019 Document Reviewed: 05/11/2019 Elsevier Patient Education  2021 [employer].

## 2020-04-10 NOTE — Progress Notes (Signed)
   I, Christoper Fabian, LAT, ATC, am serving as scribe for Dr. Clementeen Graham.  Kaitlin Cortez is a 65 y.o. female who presents to [facility] today for f/u of L wrist pain.  She was last seen by Dr. Denyse Amass on 03/14/20 and noted L wrist pain since Thanksgiving.  She works as a Quarry manager.  She was referred to PT at [facility] and advised to use a thumb spica splint.  Since her last visit, pt reports that her L thumb and wrist are doing better and feels that PT has been helping.  She is currently in a custom-made thumb splint.  She notes approximately 60-70% improvement.  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   Pertinent review of systems: No fevers or chills  Relevant historical information: GERD atrial fibrillation   Exam:  BP 104/70 (BP Location: Left Arm, Patient Position: Sitting, Cuff Size: Normal)   Pulse (!) 59   Ht 5' 4.25" (1.632 m)   Wt 202 lb 9.6 oz (91.9 kg)   SpO2 98%   BMI 34.51 kg/m  General: Well Developed, well nourished, and in no acute distress.   MSK: Left wrist slightly swollen dorsal aspect of wrist.  Minimally tender.  Normal wrist motion.  Positive Finkelstein's test.    Assessment and Plan: 65 y.o. female with de Quervain's tenosynovitis left wrist.  Significantly improved with hand physical therapy.  Patient has a custom splint that is helpful.  Plan for continued conservative management home exercise program and a bit of watchful waiting.  Add nitroglycerin patch protocol which should be helpful.  Recheck back as needed.   PDMP not reviewed this encounter. No orders of the defined types were placed in this encounter.  Meds ordered this encounter  Medications  . nitroGLYCERIN (NITRODUR - DOSED IN MG/24 HR) 0.2 mg/hr patch    Sig: Apply 1/4 patch daily to tendon for tendonitis.    Dispense:  30 patch    Refill:  1      Discussed warning signs or symptoms. Please see discharge instructions. Patient expresses understanding.   The above documentation has been reviewed and is accurate and complete Clementeen Graham, M.D.  Total encounter time 20 minutes including face-to-face time with the patient and, reviewing past medical record, and charting on the date of service.   Discussed treatment plan and options.

## 2020-04-11 ENCOUNTER — Other Ambulatory Visit: Payer: Self-pay

## 2020-04-11 ENCOUNTER — Ambulatory Visit: Payer: Self-pay

## 2020-04-11 ENCOUNTER — Encounter: Payer: Self-pay | Admitting: Family Medicine

## 2020-04-11 ENCOUNTER — Ambulatory Visit (INDEPENDENT_AMBULATORY_CARE_PROVIDER_SITE_OTHER): Payer: Commercial Managed Care - PPO | Admitting: Family Medicine

## 2020-04-11 VITALS — BP 104/70 | HR 59 | Ht 64.25 in | Wt 202.6 lb

## 2020-04-11 DIAGNOSIS — M654 Radial styloid tenosynovitis [de Quervain]: Secondary | ICD-10-CM | POA: Diagnosis not present

## 2020-04-11 MED ORDER — NITROGLYCERIN 0.2 MG/HR TD PT24: MEDICATED_PATCH | TRANSDERMAL | 1 refills | Status: DC

## 2020-04-11 NOTE — Patient Instructions (Signed)
Thank you for coming in today.  Nitroglycerin Protocol   Apply 1/4 nitroglycerin patch to affected area daily.  Change position of patch within the affected area every 24 hours.  You may experience a headache during the first 1-2 weeks of using the patch, these should subside.  If you experience headaches after beginning nitroglycerin patch treatment, you may take your preferred over the counter pain reliever.  Another side effect of the nitroglycerin patch is skin irritation or rash related to patch adhesive.  Please notify our office if you develop more severe headaches or rash, and stop the patch.  Tendon healing with nitroglycerin patch may require 12 to 24 weeks depending on the extent of injury.  Men should not use if taking Viagra, Cialis, or Levitra.   Do not use if you have migraines or rosacea.   Continue home exercises.   Stretch out and wean out of PT.   Recheck with me as needed.

## 2020-04-18 ENCOUNTER — Encounter: Payer: Self-pay | Admitting: Family Medicine

## 2020-05-01 ENCOUNTER — Other Ambulatory Visit: Payer: Self-pay | Admitting: Physician Assistant

## 2020-05-10 ENCOUNTER — Encounter: Payer: Self-pay | Admitting: Physician Assistant

## 2020-05-11 ENCOUNTER — Other Ambulatory Visit: Payer: Self-pay

## 2020-05-11 MED ORDER — PANTOPRAZOLE SODIUM 40 MG PO TBEC: DELAYED_RELEASE_TABLET | ORAL | 3 refills | Status: DC

## 2020-07-10 ENCOUNTER — Encounter: Payer: Self-pay | Admitting: Physician Assistant

## 2020-07-24 ENCOUNTER — Encounter: Payer: Self-pay | Admitting: Physician Assistant

## 2020-08-18 IMAGING — CT CT ABD-PELV W/O CM
2 of 4 series · 16 of 46 positions shown, 18 images · non-contrast
Comparison: None.

CLINICAL DATA: 63-year-old female with abdominal and pelvic pain
for 1 day.

EXAM:
CT ABDOMEN AND PELVIS WITHOUT CONTRAST
TECHNIQUE: Multidetector CT imaging of the abdomen and pelvis was performed
following the standard protocol without IV contrast.

[Series 3: ap without · axial · non-contrast · 0.79mm/px · z∈[-424,-14]mm · 13 of 92 slices shown, 15 images]
[im 5/92  soft-tissue]
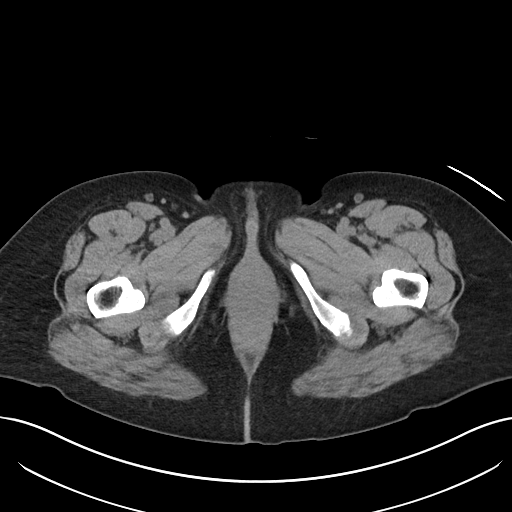
[im 5/92  bone]
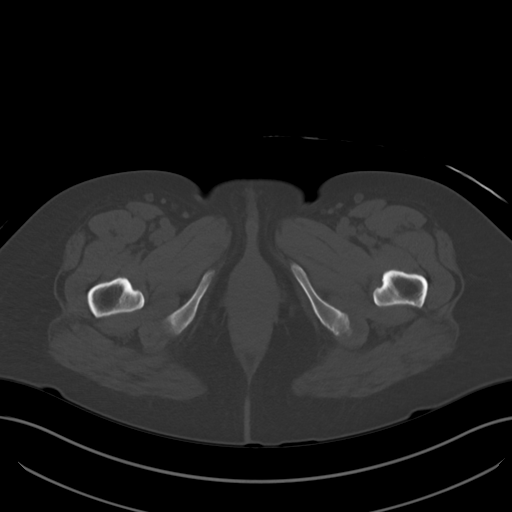
[im 13/92  soft-tissue]
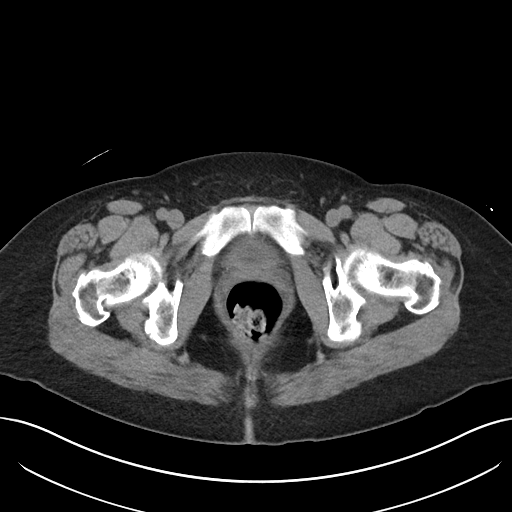
[im 21/92  soft-tissue]
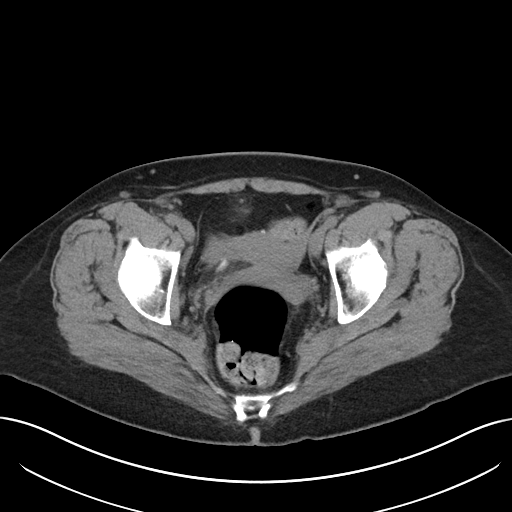
[im 25/92  soft-tissue]
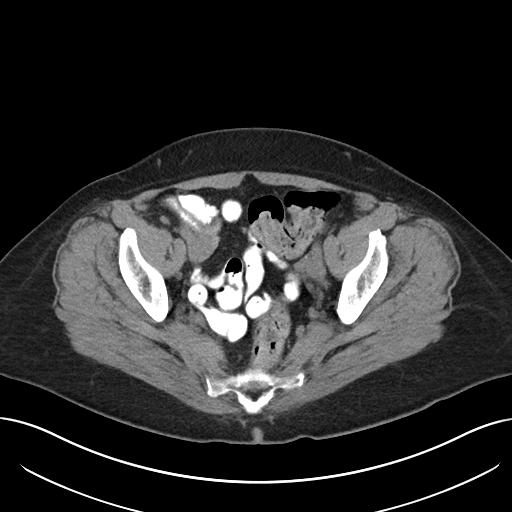
[im 34/92  soft-tissue]
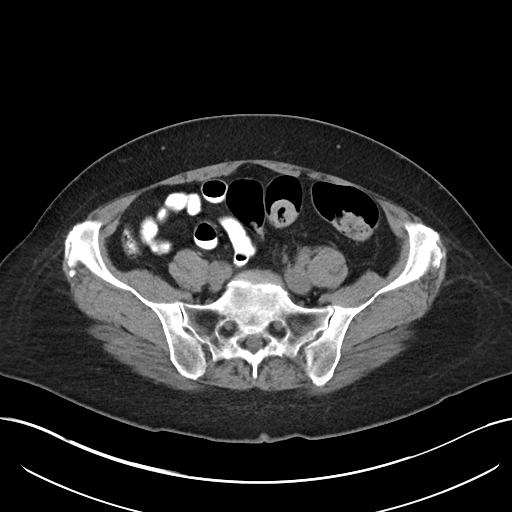
[im 38/92  soft-tissue]
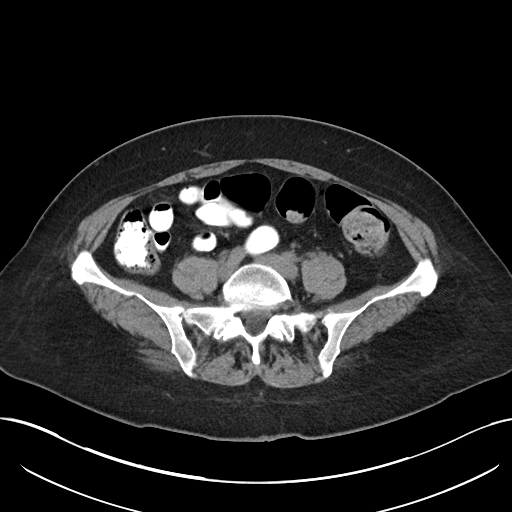
[im 46/92  soft-tissue]
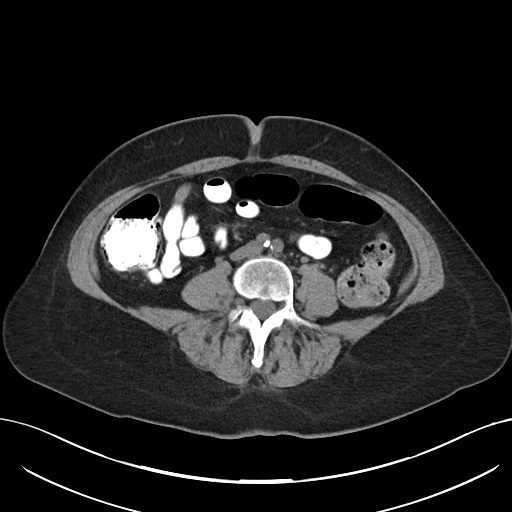
[im 54/92  soft-tissue]
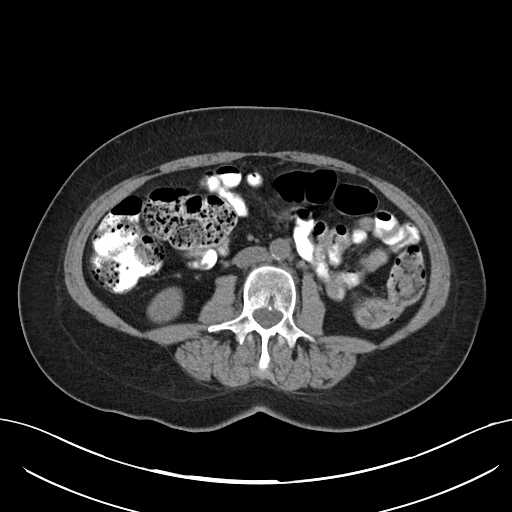
[im 58/92  soft-tissue]
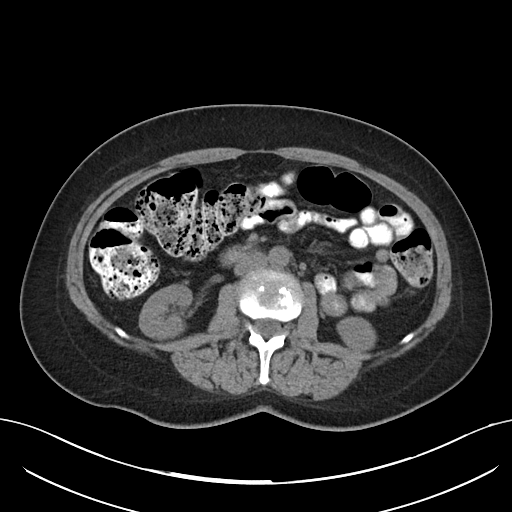
[im 58/92  bone]
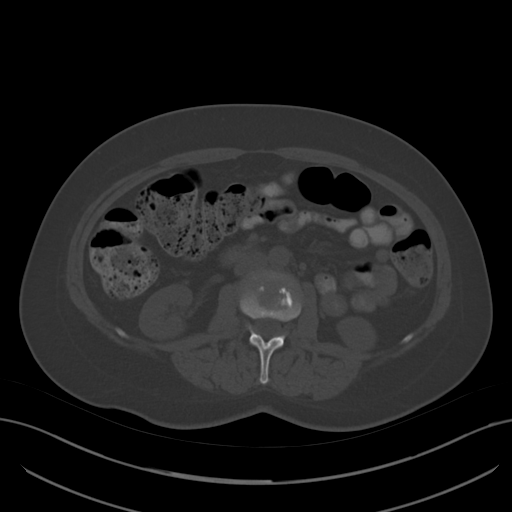
[im 67/92  soft-tissue]
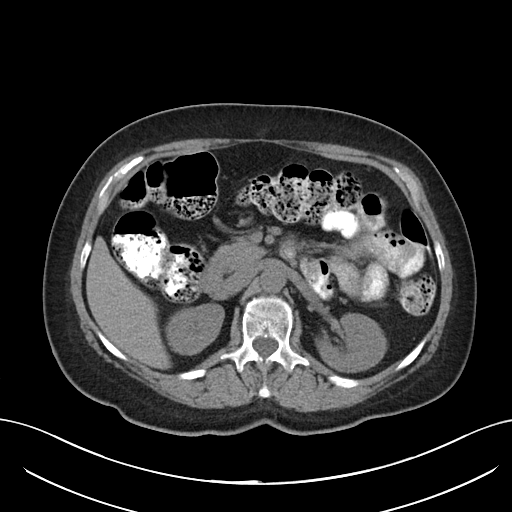
[im 71/92  soft-tissue]
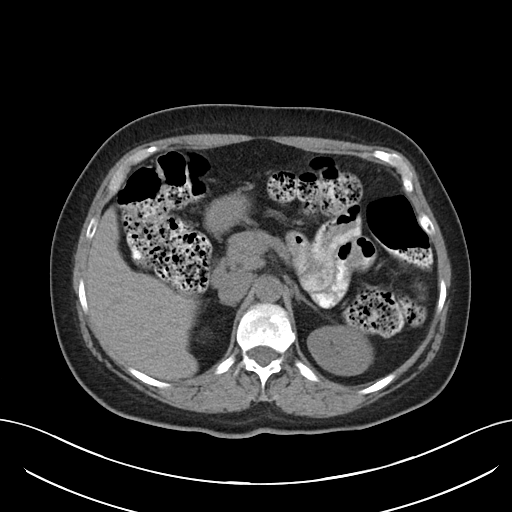
[im 79/92  soft-tissue]
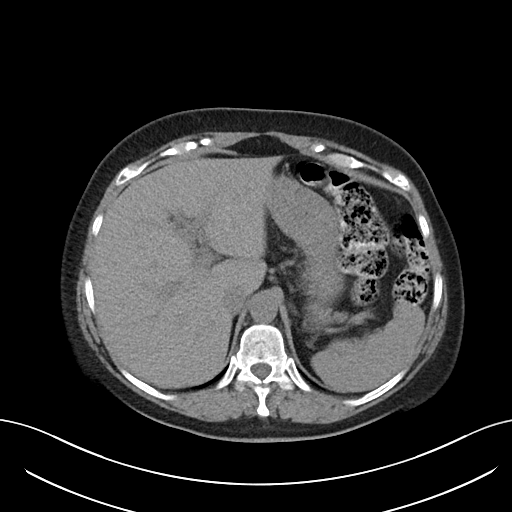
[im 87/92  soft-tissue]
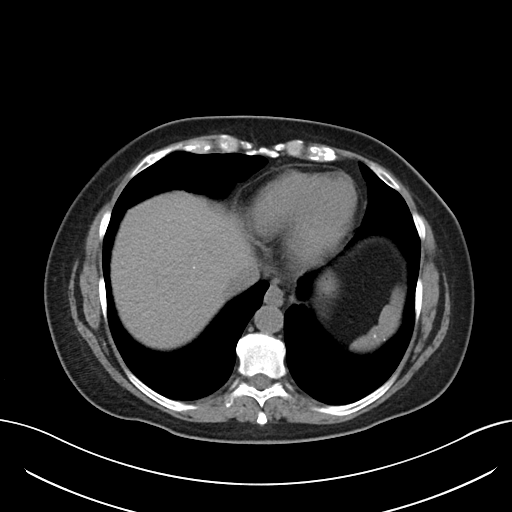

[Series 6: cor · coronal · 0.75mm/px · 3 of 92 slices shown]
[im 31/92  soft-tissue]
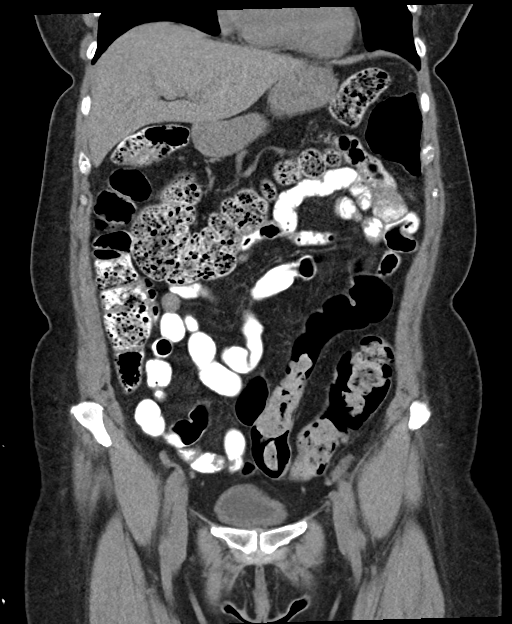
[im 41/92  soft-tissue]
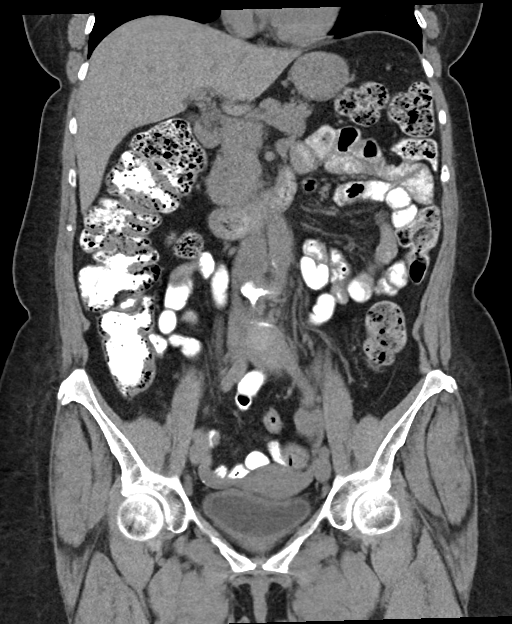
[im 51/92  soft-tissue]
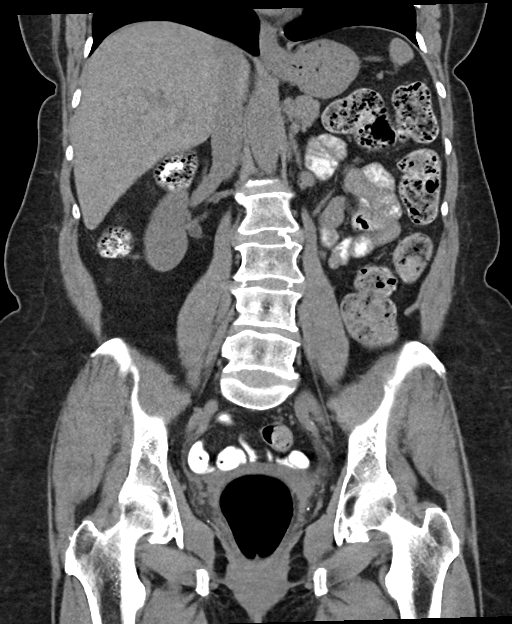

[16 of 46 positions shown; findings below may reference images not displayed]

FINDINGS: Please note that parenchymal abnormalities may be missed without
intravenous contrast.

Lower chest: No acute abnormality. Calcified granulomas in the lung
bases noted.

Hepatobiliary: The liver is unremarkable. The patient is status
cholecystectomy. No biliary dilatation.

Pancreas: Unremarkable

Spleen: No significant abnormality

Adrenals/Urinary Tract: The kidneys, adrenal glands and bladder are
unremarkable. No hydronephrosis or urinary calculi.

Stomach/Bowel: Stomach is within normal limits. No evidence of bowel
obstruction, bowel wall thickening, distention, or inflammatory
changes. Moderate to large amount of colonic stool noted.

Vascular/Lymphatic: Aortic atherosclerosis. No enlarged abdominal or
pelvic lymph nodes.

Reproductive: Uterus and bilateral adnexa are unremarkable.

Other: No ascites, focal collection or pneumoperitoneum.

Musculoskeletal: No acute or suspicious bony abnormalities.
IMPRESSION: 1. No evidence of acute abnormality.
2. Moderate to large amount of colonic stool.
3. Aortic Atherosclerosis ([id number]).

## 2020-09-07 ENCOUNTER — Encounter: Payer: Self-pay | Admitting: Family Medicine

## 2020-09-07 ENCOUNTER — Ambulatory Visit (INDEPENDENT_AMBULATORY_CARE_PROVIDER_SITE_OTHER): Payer: Commercial Managed Care - PPO | Admitting: Family Medicine

## 2020-09-07 ENCOUNTER — Other Ambulatory Visit: Payer: Self-pay

## 2020-09-07 VITALS — BP 118/80 | HR 59 | Temp 98.0°F | Ht 64.23 in | Wt 202.9 lb

## 2020-09-07 DIAGNOSIS — L237 Allergic contact dermatitis due to plants, except food: Secondary | ICD-10-CM | POA: Diagnosis not present

## 2020-09-07 MED ORDER — PREDNISONE 10 MG PO TABS: ORAL_TABLET | ORAL | 0 refills | Status: DC

## 2020-09-07 NOTE — Progress Notes (Signed)
Patient ID: Kaitlin Cortez, female   DOB: 30-Jul-1955, 65 y.o.   MRN: [id number]

## 2020-09-07 NOTE — Progress Notes (Signed)
Established Patient Office Visit  Subjective:  Patient ID: Kaitlin Cortez, female    DOB: [birth year]  Age: 65 y.o. MRN: [id number]  CC:  Chief Complaint  Patient presents with   Poison Ivy    Patient exposed to poison ivy, x6 days, Tried Cortisone, Benadryly with little relief    HPI Kaitlin Cortez presents for pruritic rash widespread with onset Sunday.  Saturday she did a lot of work outside [employer]  By Sunday she had blistery rash on her extremities upper and lower and since that time has developed some involvement of the face and especially upper anterior chest to the neck.  Severe pruritus at times.  She is tried topical things without much improvement.  Only relieving factor is hot showers which provide temporary relief.  She tried topical cortisone without improvement.  Also use calamine.  Past Medical History:  Diagnosis Date   GERD (gastroesophageal reflux disease)    Hyperlipidemia    Paroxysmal atrial fibrillation (HCC) 09/28/2018    Past Surgical History:  Procedure Laterality Date   CARPAL TUNNEL RELEASE Bilateral 2015   CHOLECYSTECTOMY  1998   DIAGNOSTIC LAPAROSCOPY WITH REMOVAL OF ECTOPIC PREGNANCY Right 1996    Family History  Problem Relation Age of Onset   AAA (abdominal aortic aneurysm) Mother    Dementia Father    Colon polyps Father    Throat cancer Father    Esophageal cancer Neg Hx    Colon cancer Neg Hx    Pancreatic cancer Neg Hx    Stomach cancer Neg Hx     Social History   Socioeconomic History   Marital status: Married    Spouse name: Not on file   Number of children: Not on file   Years of education: Not on file   Highest education level: Not on file  Occupational History   Not on file  Tobacco Use   Smoking status: Never   Smokeless tobacco: Never  Vaping Use   Vaping Use: Never used  Substance and Sexual Activity   Alcohol use: Yes    Alcohol/week: 1.0 standard drink    Types: 1 Glasses of wine per week    Comment: occ    Drug use: Never   Sexual activity: Yes    Birth control/protection: None  Other Topics Concern   Not on file  Social History Narrative   Moved from [place].     Social Determinants of Health   Financial Resource Strain: Not on file  Food Insecurity: Not on file  Transportation Needs: Not on file  Physical Activity: Not on file  Stress: Not on file  Social Connections: Not on file  Intimate Partner Violence: Not on file    Outpatient Medications Prior to Visit  Medication Sig Dispense Refill   aspirin EC 81 MG tablet Take 81 mg by mouth daily. Swallow whole.     diltiazem (CARDIZEM) 30 MG tablet Take 1 Tablet Every 4 Hours As Needed For HR >100 45 tablet 2   flecainide (TAMBOCOR) 50 MG tablet TAKE 1 TABLET(50 MG) BY MOUTH TWICE DAILY 180 tablet 3   metroNIDAZOLE (METROCREAM) 0.75 % cream Apply topically 2 (two) times daily. 45 g 0   Multiple Vitamin (MULTIVITAMIN) tablet Take 1 tablet by mouth daily.     nitroGLYCERIN (NITRODUR - DOSED IN MG/24 HR) 0.2 mg/hr patch Apply 1/4 patch daily to tendon for tendonitis. 30 patch 1   pantoprazole (PROTONIX) 40 MG tablet TAKE 1 TABLET(40 MG) BY MOUTH AT BEDTIME  90 tablet 3   No facility-administered medications prior to visit.    Allergies  Allergen Reactions   Statins Other (See Comments)    myalgias   Versed [Midazolam]     Makes her throw up    ROS Review of Systems  Constitutional:  Negative for chills and fever.  Skin:  Positive for rash.     Objective:    Physical Exam Vitals reviewed.  Constitutional:      Appearance: Normal appearance.  Cardiovascular:     Rate and Rhythm: Normal rate and regular rhythm.  Pulmonary:     Effort: Pulmonary effort is normal.     Breath sounds: Normal breath sounds.  Skin:    Findings: Rash present.     Comments: She has widespread involvement of rash involving upper and lower extremities bilaterally with significant involvement anterior chest and some facial involvement but not  in the eye.  She has some cheek involvement.  Rash is erythematous with slightly raised vesicular surface consistent with contact dermatitis.  Neurological:     Mental Status: She is alert.    BP 118/80 (BP Location: Left Arm, Patient Position: Sitting, Cuff Size: Normal)   Pulse (!) 59   Temp 98 F (36.7 C) (Oral)   Ht 5' 4.23" (1.631 m)   Wt 202 lb 14.4 oz (92 kg)   SpO2 95%   BMI 34.58 kg/m  Wt Readings from Last 3 Encounters:  09/07/20 202 lb 14.4 oz (92 kg)  04/11/20 202 lb 9.6 oz (91.9 kg)  03/14/20 201 lb 9.6 oz (91.4 kg)     Health Maintenance Due  Topic Date Due   DEXA SCAN  Never done   Zoster Vaccines- Shingrix (1 of 2) Never done   COVID-19 Vaccine (4 - Booster for Pfizer series) 05/27/2020    There are no preventive care reminders to display for this patient.  Lab Results  Component Value Date   TSH 2.60 09/29/2018   Lab Results  Component Value Date   WBC 4.8 01/26/2020   HGB 14.5 01/26/2020   HCT 43.0 01/26/2020   MCV 92.9 01/26/2020   PLT 218 01/26/2020   Lab Results  Component Value Date   NA 139 01/26/2020   K 5.0 01/26/2020   CO2 29 01/26/2020   GLUCOSE 82 01/26/2020   BUN 15 01/26/2020   CREATININE 1.02 (H) 01/26/2020   BILITOT 0.7 01/26/2020   ALKPHOS 93 04/20/2019   AST 19 01/26/2020   ALT 13 01/26/2020   PROT 6.4 01/26/2020   ALBUMIN 4.0 04/20/2019   CALCIUM 9.9 01/26/2020   GFR 50.16 (L) 04/20/2019   Lab Results  Component Value Date   CHOL 242 (H) 09/29/2018   Lab Results  Component Value Date   HDL 44.60 09/29/2018   Lab Results  Component Value Date   LDLCALC 160 (H) 09/29/2018   Lab Results  Component Value Date   TRIG 187.0 (H) 09/29/2018   Lab Results  Component Value Date   CHOLHDL 5 09/29/2018   No results found for: HGBA1C    Assessment & Plan:   Problem List Items Addressed This Visit   None Visit Diagnoses     Allergic contact dermatitis due to plants, except food    -  Primary     Widespread  involvement.  No relief with over-the-counter topicals.  We recommended steroids.  Reviewed potential side effects of steroids.  Start oral prednisone taper.  Follow-up promptly for any signs of secondary infection.  Meds ordered this encounter  Medications   predniSONE (DELTASONE) 10 MG tablet    Sig: Taper as follows: 6-6-4-4-4-3-3-3-2-2-1-1    Dispense:  39 tablet    Refill:  0    Follow-up: No follow-ups on file.    Evelena Peat, MD

## 2020-09-21 ENCOUNTER — Encounter: Payer: Self-pay | Admitting: Physician Assistant

## 2020-09-21 ENCOUNTER — Other Ambulatory Visit: Payer: Self-pay | Admitting: Physician Assistant

## 2020-09-21 MED ORDER — DOXYCYCLINE HYCLATE 100 MG PO TABS: 100.0000 mg | ORAL_TABLET | Freq: Two times a day (BID) | ORAL | 0 refills | Status: DC

## 2020-09-21 NOTE — Telephone Encounter (Signed)
Please see message and advise 

## 2020-10-05 ENCOUNTER — Ambulatory Visit: Payer: Commercial Managed Care - PPO | Admitting: Cardiology

## 2020-10-28 ENCOUNTER — Other Ambulatory Visit: Payer: Self-pay | Admitting: Cardiology

## 2020-11-23 ENCOUNTER — Telehealth: Payer: Self-pay

## 2020-11-23 NOTE — Telephone Encounter (Signed)
Error

## 2020-11-26 ENCOUNTER — Other Ambulatory Visit: Payer: Self-pay

## 2020-11-26 ENCOUNTER — Ambulatory Visit (INDEPENDENT_AMBULATORY_CARE_PROVIDER_SITE_OTHER): Payer: Commercial Managed Care - PPO | Admitting: Physician Assistant

## 2020-11-26 ENCOUNTER — Encounter: Payer: Self-pay | Admitting: Physician Assistant

## 2020-11-26 VITALS — BP 100/60 | HR 52 | Temp 98.2°F | Ht 64.25 in | Wt 202.0 lb

## 2020-11-26 DIAGNOSIS — H5789 Other specified disorders of eye and adnexa: Secondary | ICD-10-CM | POA: Diagnosis not present

## 2020-11-26 DIAGNOSIS — L719 Rosacea, unspecified: Secondary | ICD-10-CM

## 2020-11-26 DIAGNOSIS — M79645 Pain in left finger(s): Secondary | ICD-10-CM | POA: Diagnosis not present

## 2020-11-26 MED ORDER — DOXYCYCLINE HYCLATE 100 MG PO TABS: 100.0000 mg | ORAL_TABLET | Freq: Every day | ORAL | 1 refills | Status: DC

## 2020-11-26 NOTE — Progress Notes (Signed)
Kaitlin Cortez is a 65 y.o. female here for a new problem.   History of Present Illness:   Chief Complaint  Patient presents with   Rosacea   Eye Problem    HPI  Rosacea Kaitlin Cortez says that prior to going on vacation, her face got Cortez inflamed that it felt like her face was going to crack open. [name] does report that she recently had poison ivy. She is interested in restarting doxycycline 100 mg once a day.  She trial topical flagyl which was not as effective for her.  Swollen Right Eye [name] has noticed that upon waking up her right eye is swollen, almost to the point that it's closed. She hasn't used anything out of the ordinary and has even switched most of her products to vegan type products. She has reported that her stye has returned and stayed there. She does have an upcoming appointment with her ophthalmologist.  Denies fever, chills, trauma to eye, difficulty moving eye, eye pain.  Arthritis Kaitlin Cortez says that she usually has to flex her left hand due to her arthritis, but has found it difficult to move her left index finger. Denies taking any OTC medications for arthritis. Denies recent injury.     Past Medical History:  Diagnosis Date   GERD (gastroesophageal reflux disease)    Hyperlipidemia    Paroxysmal atrial fibrillation (HCC) 09/28/2018     Social History   Tobacco Use   Smoking status: Never   Smokeless tobacco: Never  Vaping Use   Vaping Use: Never used  Substance Use Topics   Alcohol use: Yes    Alcohol/week: 1.0 standard drink    Types: 1 Glasses of wine per week    Comment: occ   Drug use: Never    Past Surgical History:  Procedure Laterality Date   CARPAL TUNNEL RELEASE Bilateral 2015   CHOLECYSTECTOMY  1998   DIAGNOSTIC LAPAROSCOPY WITH REMOVAL OF ECTOPIC PREGNANCY Right 1996    Family History  Problem Relation Age of Onset   AAA (abdominal aortic aneurysm) Mother    Dementia Father    Colon polyps Father    Throat cancer Father     Esophageal cancer Neg Hx    Colon cancer Neg Hx    Pancreatic cancer Neg Hx    Stomach cancer Neg Hx     Allergies  Allergen Reactions   Statins Other (See Comments)    myalgias   Versed [Midazolam]     Makes her throw up    Current Medications:   Current Outpatient Medications:    aspirin EC 81 MG tablet, Take 81 mg by mouth daily. Swallow whole., Disp: , Rfl:    flecainide (TAMBOCOR) 50 MG tablet, TAKE 1 TABLET(50 MG) BY MOUTH TWICE DAILY, Disp: 180 tablet, Rfl: 3   Multiple Vitamin (MULTIVITAMIN) tablet, Take 1 tablet by mouth daily., Disp: , Rfl:    pantoprazole (PROTONIX) 40 MG tablet, TAKE 1 TABLET(40 MG) BY MOUTH AT BEDTIME, Disp: 90 tablet, Rfl: 3   Review of Systems:   ROS Negative unless otherwise specified per HPI.  Vitals:   Vitals:   11/26/20 1259  BP: 100/60  Pulse: (!) 52  Temp: 98.2 F (36.8 C)  TempSrc: Temporal  SpO2: 99%  Weight: 202 lb (91.6 kg)  Height: 5' 4.25" (1.632 m)     Body mass index is 34.4 kg/m.  Physical Exam:   Physical Exam Vitals and nursing note reviewed.  Constitutional:      General:  She is not in acute distress.    Appearance: She is well-developed. She is not ill-appearing or toxic-appearing.  Eyes:     General:        Right eye: Hordeolum present.     Extraocular Movements: Extraocular movements intact.     Right eye: Normal extraocular motion.     Left eye: Normal extraocular motion.     Comments: Swelling under R eye area without erythema  Cardiovascular:     Rate and Rhythm: Normal rate and regular rhythm.     Pulses: Normal pulses.     Heart sounds: Normal heart sounds, S1 normal and S2 normal.  Pulmonary:     Effort: Pulmonary effort is normal.     Breath sounds: Normal breath sounds.  Musculoskeletal:     Comments: Left index finger; tenderness to PIP and decreased ROM   Skin:    General: Skin is warm and dry.     Comments: Bilateral cheeks erythema   Neurological:     Mental Status: She is alert.      GCS: GCS eye subscore is 4. GCS verbal subscore is 5. GCS motor subscore is 6.     Comments: Grip strengty 5/5  Psychiatric:        Speech: Speech normal.        Behavior: Behavior normal. Behavior is cooperative.    Assessment and Plan:   Rosacea Uncontrolled Restart doxycycline 100 mg daily May increase to BID if needed Referral to dermatology  Pain of finger of left hand No red flags on exam Suspect possible OA Recommend topical voltaren bid, tylenol arthritis or ibuprofen If lack of improvement or any worsening, will refer to Sports Medicine  Eye swollen, right Recurrent stye She is seeing eye doctor soon No evidence of infection warranting systemic abx Continue to monitor  I,Havlyn C Ratchford,acting as a scribe for [employer], PA.,have documented all relevant documentation on the behalf of Kaitlin Motto, PA,as directed by  Kaitlin Motto, PA while in the presence of Kaitlin Cortez, [place].   I, Kaitlin Cortez, [place], have reviewed all documentation for this visit. The documentation on 11/26/20 for the exam, diagnosis, procedures, and orders are all accurate and complete.   Kaitlin Motto, PA-C

## 2020-11-26 NOTE — Assessment & Plan Note (Signed)
Uncontrolled Restart 100 mg doxycycline daily Referral to dermatology

## 2020-12-12 NOTE — Progress Notes (Signed)
Cardiology Office Note   Date:  12/14/2020   ID:  Kaitlin Cortez, DOB 09-03-1955, MRN [id number]  PCP:  Jarold Motto, PA  Cardiologist:   Rollene Rotunda, MD Referring:  Jarold Motto, PA  Chief Complaint  Patient presents with   Atrial Fibrillation       History of Present Illness: Kaitlin Cortez is a 65 y.o. female who is referred by Jarold Motto, PA to evaluate for atrial fib.  She has a history of this and moved from [place].  She has been treated with Flecainide.  She said she has had atrial fibrillation for years.  She never really tolerated beta-blockers or apparently calcium channel blockers.  She gets a very low heart rate.    Since I saw her she has occasional bouts of atrial fibrillation lasting for a few hours.  However, its not as severe as it was when she has been in the hospital twice with.  He goes away if she goes out walking or do some other activities.  She otherwise feels well.  She is not exercising as much as she plans to.  She denies any chest pressure, neck or arm discomfort.  She has had no new shortness of breath, PND or orthopnea   Past Medical History:  Diagnosis Date   GERD (gastroesophageal reflux disease)    Hyperlipidemia    Paroxysmal atrial fibrillation (HCC) 09/28/2018    Past Surgical History:  Procedure Laterality Date   CARPAL TUNNEL RELEASE Bilateral 2015   CHOLECYSTECTOMY  1998   DIAGNOSTIC LAPAROSCOPY WITH REMOVAL OF ECTOPIC PREGNANCY Right 1996     Current Outpatient Medications  Medication Sig Dispense Refill   aspirin EC 81 MG tablet Take 81 mg by mouth daily. Swallow whole.     doxycycline (VIBRA-TABS) 100 MG tablet Take 1 tablet (100 mg total) by mouth daily. 90 tablet 1   flecainide (TAMBOCOR) 50 MG tablet Take 1 tablet by mouth twice daily 180 tablet 3   Multiple Vitamin (MULTIVITAMIN) tablet Take 1 tablet by mouth daily.     pantoprazole (PROTONIX) 40 MG tablet TAKE 1 TABLET(40 MG) BY MOUTH AT BEDTIME 90 tablet  3   No current facility-administered medications for this visit.    Allergies:   Statins and Versed [midazolam]    ROS:  Please see the history of present illness.   Otherwise, review of systems are positive for none.   All other systems are reviewed and negative.    PHYSICAL EXAM: VS:  BP 118/70   Pulse 60   Ht 5\' 5"  (1.651 m)   Wt 207 lb (93.9 kg)   SpO2 97%   BMI 34.45 kg/m  , BMI Body mass index is 34.45 kg/m. GENERAL:  Well appearing NECK:  No jugular venous distention, waveform within normal limits, carotid upstroke brisk and symmetric, no bruits, no thyromegaly LUNGS:  Clear to auscultation bilaterally CHEST:  Unremarkable HEART:  PMI not displaced or sustained,S1 and S2 within normal limits, no S3, no S4, no clicks, no rubs, no murmurs ABD:  Flat, positive bowel sounds normal in frequency in pitch, no bruits, no rebound, no guarding, no midline pulsatile mass, no hepatomegaly, no splenomegaly EXT:  2 plus pulses throughout, no edema, no cyanosis no clubbing    EKG:  EKG is ordered today. The ekg ordered today demonstrates sinus rhythm, rate 60, axis within normal limits, intervals within normal limits, no acute ST-T wave changes.   Recent Labs: 01/26/2020: ALT 13; BUN 15; Creat 1.02; Hemoglobin  14.5; Platelets 218; Potassium 5.0; Sodium 139    Lipid Panel    Component Value Date/Time   CHOL 242 (H) 09/29/2018 0829   TRIG 187.0 (H) 09/29/2018 0829   HDL 44.60 09/29/2018 0829   CHOLHDL 5 09/29/2018 0829   VLDL 37.4 09/29/2018 0829   LDLCALC 160 (H) 09/29/2018 0829      Wt Readings from Last 3 Encounters:  12/14/20 207 lb (93.9 kg)  11/26/20 202 lb (91.6 kg)  09/07/20 202 lb 14.4 oz (92 kg)      Other studies Reviewed: Additional studies/ records that were reviewed today include: None. Review of the above records demonstrates:  NA   ASSESSMENT AND PLAN:  PAF:   She has brief paroxysms. [name] has a CHA2DS2 - VASc score of 1.  No change  in therapy.  We talked about taking an extra flecainide if she has a very prolonged paroxysm.  DYSLIPIDEMIA: She has not tolerated statins.  She had a calcium score of 0.  I think her lipids can be diet controlled.    Current medicines are reviewed at length with the patient today.  The patient does not have concerns regarding medicines.  The following changes have been made: None  Labs/ tests ordered today include: None  Orders Placed This Encounter  Procedures   EKG 12-Lead      Disposition:   FU with me in 18 months   Signed, Rollene Rotunda, MD  12/14/2020 8:16 AM    [facility]

## 2020-12-13 ENCOUNTER — Encounter: Payer: Commercial Managed Care - PPO | Admitting: Physician Assistant

## 2020-12-14 ENCOUNTER — Other Ambulatory Visit: Payer: Self-pay

## 2020-12-14 ENCOUNTER — Encounter: Payer: Self-pay | Admitting: Cardiology

## 2020-12-14 ENCOUNTER — Ambulatory Visit (INDEPENDENT_AMBULATORY_CARE_PROVIDER_SITE_OTHER): Payer: Commercial Managed Care - PPO | Admitting: Cardiology

## 2020-12-14 VITALS — BP 118/70 | HR 60 | Ht 65.0 in | Wt 207.0 lb

## 2020-12-14 DIAGNOSIS — E785 Hyperlipidemia, unspecified: Secondary | ICD-10-CM | POA: Diagnosis not present

## 2020-12-14 DIAGNOSIS — I480 Paroxysmal atrial fibrillation: Secondary | ICD-10-CM

## 2020-12-14 MED ORDER — FLECAINIDE ACETATE 50 MG PO TABS: ORAL_TABLET | ORAL | 3 refills | Status: DC

## 2020-12-14 NOTE — Patient Instructions (Signed)
Medication Instructions:  Your physician recommends that you continue on your current medications as directed. Please refer to the Current Medication list given to you today.  *If you need a refill on your cardiac medications before your next appointment, please call your pharmacy*  Lab Work: NONE ordered at this time of appointment   If you have labs (blood work) drawn today and your tests are completely normal, you will receive your results only by: MyChart Message (if you have MyChart) OR A paper copy in the mail If you have any lab test that is abnormal or we need to change your treatment, we will call you to review the results.  Testing/Procedures: NONE ordered at this time of appointment   Follow-Up: At [facility], you and your health needs are our priority.  As part of our continuing mission to provide you with exceptional heart care, we have created designated Provider Care Teams.  These Care Teams include your primary Cardiologist (physician) and Advanced Practice Providers (APPs -  Physician Assistants and Nurse Practitioners) who all work together to provide you with the care you need, when you need it.  Your next appointment:   18 month(s)  The format for your next appointment:   In Person  Provider:   Rollene Rotunda, MD    Other Instructions

## 2021-01-21 LAB — HM MAMMOGRAPHY

## 2021-02-15 ENCOUNTER — Encounter: Payer: Self-pay | Admitting: Physician Assistant

## 2021-02-22 NOTE — Progress Notes (Signed)
Subjective:    Kaitlin Cortez is a 66 y.o. female and is here for a comprehensive physical exam.   HPI  Health Maintenance Due  Topic Date Due   DEXA SCAN  Never done   Zoster Vaccines- Shingrix (1 of 2) Never done    Acute Concerns: L finger pain As previously discussed in past visit on 11/26/20, Kaitlin Cortez is still experiencing difficulty in moving her left index finger. States that she believes it has gotten worse to the point where her finger has gotten stuck once. She is interested in being referred to sports medicine for further evaluation.   Chronic Issues: HLD Pt is currently not on medication for this issue due to being statin intolerant.  She does have a previous calcium score of 0. Her current cardiologist, Dr. Antoine PocheHochrein, stated that they believe her lipids can potentially be controlled through diet rather than medication.   GERD  Pt is compliant with protonix 40 mg daily with no adverse effects. Kaitlin Cortez is managing well with this medication .   Paroxysmal Atrial Fibrillation Kaitlin Cortez is currently not on any medication for this issue. Pt states that she does have occasional episodes of this that may last a few hours, but is always resolved with mild activity such as walking. Currently she is following up regularly with Dr.Hochrein, cardiology and is managing well.   Rosacea Currently compliant with taking doxycycline 100 mg daily with no adverse effects. States that she is managing well and the issue is clearing up bit by bit. She is set to follow up with dermatology this April.    Health Maintenance: Immunizations -- Covid- UTD Influenza- Due;2021 Tdap- UTD;2017 Colonoscopy -- UTD; 2022 (Cologuard) Mammogram -- UTD PAP -- UTD;2021 Bone Density -- Due Ophthalmology- UTD Dentistry- Upcoming appt. Diet -- Currently vegan.  Sleep habits -- Normal schedule despite occasional insomnia  Exercise -- Participates in occasional cardio and strength training  Weight --  Stable Mood -- Stable Weight history: Wt Readings from Last 10 Encounters:  02/25/21 203 lb 4 oz (92.2 kg)  12/14/20 207 lb (93.9 kg)  11/26/20 202 lb (91.6 kg)  09/07/20 202 lb 14.4 oz (92 kg)  04/11/20 202 lb 9.6 oz (91.9 kg)  03/14/20 201 lb 9.6 oz (91.4 kg)  01/25/20 198 lb 4 oz (89.9 kg)  01/04/20 197 lb (89.4 kg)  10/04/19 193 lb 6.4 oz (87.7 kg)  05/20/19 194 lb (88 kg)   Body mass index is 33.82 kg/m. No LMP recorded. Patient is postmenopausal. Alcohol use:  reports current alcohol use of about 1.0 standard drink per week. Tobacco use:  Tobacco Use: Low Risk    Smoking Tobacco Use: Never   Smokeless Tobacco Use: Never   Passive Exposure: Not on file     Dep[facility] 02/25/2021  Decreased Interest 0  Down, Depressed, Hopeless 0  PHQ - 2 Score 0     Other providers/specialists: Patient Care Team: [name], Lillyian Heidt, [place] as PCP - General (Physician Assistant) Rollene RotundaHochrein, James, MD as PCP - Cardiology (Cardiology)    PMHx, SurgHx, SocialHx, Medications, and Allergies were reviewed in the Visit Navigator and updated as appropriate.   Past Medical History:  Diagnosis Date   GERD (gastroesophageal reflux disease)    Hyperlipidemia    Paroxysmal atrial fibrillation (HCC) 09/28/2018     Past Surgical History:  Procedure Laterality Date   CARPAL TUNNEL RELEASE Bilateral 2015   CHOLECYSTECTOMY  1998   DIAGNOSTIC LAPAROSCOPY WITH REMOVAL OF ECTOPIC PREGNANCY  Right 1996     Family History  Problem Relation Age of Onset   AAA (abdominal aortic aneurysm) Mother    Dementia Father    Colon polyps Father    Throat cancer Father    Colon polyps Sister    Esophageal cancer Neg Hx    Colon cancer Neg Hx    Pancreatic cancer Neg Hx    Stomach cancer Neg Hx     Social History   Tobacco Use   Smoking status: Never   Smokeless tobacco: Never  Vaping Use   Vaping Use: Never used  Substance Use Topics   Alcohol use: Yes    Alcohol/week: 1.0 standard  drink    Types: 1 Glasses of wine per week    Comment: occ   Drug use: Never    Review of Systems:   Review of Systems  Constitutional:  Negative for chills, fever, malaise/fatigue and weight loss.  HENT:  Negative for hearing loss, sinus pain and sore throat.   Respiratory:  Negative for cough and hemoptysis.   Cardiovascular:  Negative for chest pain, palpitations, leg swelling and PND.  Gastrointestinal:  Negative for abdominal pain, constipation, diarrhea, heartburn, nausea and vomiting.  Genitourinary:  Negative for dysuria, frequency and urgency.  Musculoskeletal:  Negative for back pain, myalgias and neck pain.  Skin:  Negative for itching and rash.  Neurological:  Negative for dizziness, tingling, seizures and headaches.  Endo/Heme/Allergies:  Negative for polydipsia.  Psychiatric/Behavioral:  Negative for depression. The patient is not nervous/anxious.    Objective:   BP 110/70 (BP Location: Left Arm, Patient Position: Sitting, Cuff Size: Large)    Pulse (!) 52    Temp 98.1 F (36.7 C) (Temporal)    Ht 5\' 5"  (1.651 m)    Wt 203 lb 4 oz (92.2 kg)    SpO2 97%    BMI 33.82 kg/m  Body mass index is 33.82 kg/m.   General Appearance:    Alert, cooperative, no distress, appears stated age  Head:    Normocephalic, without obvious abnormality, atraumatic  Eyes:    PERRL, conjunctiva/corneas clear, EOM's intact, fundi    benign, both eyes  Ears:    Normal TM's and external ear canals, both ears  Nose:   Nares normal, septum midline, mucosa normal, no drainage    or sinus tenderness  Throat:   Lips, mucosa, and tongue normal; teeth and gums normal  Neck:   Supple, symmetrical, trachea midline, no adenopathy;    thyroid:  no enlargement/tenderness/nodules; no carotid   bruit or JVD  Back:     Symmetric, no curvature, ROM normal, no CVA tenderness  Lungs:     Clear to auscultation bilaterally, respirations unlabored  Chest Wall:    No tenderness or deformity   Heart:    Regular  rate and rhythm, S1 and S2 normal, no murmur, rub or gallop  Breast Exam:    Deferred  Abdomen:     Soft, non-tender, bowel sounds active all four quadrants,    no masses, no organomegaly  Genitalia:    Deferred  Extremities:   Extremities normal, atraumatic, no cyanosis or edema  Pulses:   2+ and symmetric all extremities  Skin:   Skin color, texture, turgor normal, no rashes or lesions  Lymph nodes:   Cervical, supraclavicular, and axillary nodes normal  Neurologic:   CNII-XII intact, normal strength, sensation and reflexes    throughout    Assessment/Plan:   Encounter for annual general medical  examination with abnormal findings in adult Today patient counseled on age appropriate routine health concerns for screening and prevention, each reviewed and up to date or declined. Immunizations reviewed and up to date or declined. Labs ordered and reviewed. Risk factors for depression reviewed and negative. Hearing function and visual acuity are intact. ADLs screened and addressed as needed. Functional ability and level of safety reviewed and appropriate. Education, counseling and referrals performed based on assessed risks today. Patient provided with a copy of personalized plan for preventive services.  Hyperlipidemia, unspecified hyperlipidemia type Update labs today She is statin intolerant and CAC score is 0  Pain of finger of left hand No red flags on exam Will refer to sports medicine for further evaluation   Rosacea Improving Continue doxycycline 100 mg daily  Refill doxycycline 100 mg daily x 3 months Follow up with dermatology as scheduled, or sooner with me if symptoms worsen or concerns occur  Gastroesophageal reflux disease, unspecified whether esophagitis present Controlled Continue protonix 40 mg daily  Refill protonix 40 mg x 3 months Follow up as needed  Obesity, unspecified classification, unspecified obesity type, unspecified whether serious comorbidity  present Encouraged patient to continue participation in daily exercise and making healthier dietary choices  PAF Stable Management per cardiology  Vegan Update labs today, will make recommendations accordingly   - Vitamin B12   Patient Counseling: [x]    Nutrition: Stressed importance of moderation in sodium/caffeine intake, saturated fat and cholesterol, caloric balance, sufficient intake of fresh fruits, vegetables, fiber, calcium, iron, and 1 mg of folate supplement per day (for females capable of pregnancy).  [x]    Stressed the importance of regular exercise.   [x]    Substance Abuse: Discussed cessation/primary prevention of tobacco, alcohol, or other drug use; driving or other dangerous activities under the influence; availability of treatment for abuse.   [x]    Injury prevention: Discussed safety belts, safety helmets, smoke detector, smoking near bedding or upholstery.   [x]    Sexuality: Discussed sexually transmitted diseases, partner selection, use of condoms, avoidance of unintended pregnancy  and contraceptive alternatives.  [x]    Dental health: Discussed importance of regular tooth brushing, flossing, and dental visits.  [x]    Health maintenance and immunizations reviewed. Please refer to Health maintenance section.   I,Havlyn C Ratchford,acting as a for , PA.,have documented all relevant documentation on the behalf of , PA,as directed by  , PA while in the presence of , .   I, , Neurosurgeon, have reviewed all documentation for this visit. The documentation on 02/25/21 for the exam, diagnosis, procedures, and orders are all accurate and complete.   Jarold Motto, PA-C [facility]

## 2021-02-25 ENCOUNTER — Other Ambulatory Visit: Payer: Self-pay

## 2021-02-25 ENCOUNTER — Ambulatory Visit (INDEPENDENT_AMBULATORY_CARE_PROVIDER_SITE_OTHER): Payer: Commercial Managed Care - PPO | Admitting: Physician Assistant

## 2021-02-25 ENCOUNTER — Encounter: Payer: Self-pay | Admitting: Physician Assistant

## 2021-02-25 VITALS — BP 110/70 | HR 52 | Temp 98.1°F | Ht 65.0 in | Wt 203.2 lb

## 2021-02-25 DIAGNOSIS — Z0001 Encounter for general adult medical examination with abnormal findings: Secondary | ICD-10-CM | POA: Diagnosis not present

## 2021-02-25 DIAGNOSIS — L719 Rosacea, unspecified: Secondary | ICD-10-CM

## 2021-02-25 DIAGNOSIS — I480 Paroxysmal atrial fibrillation: Secondary | ICD-10-CM

## 2021-02-25 DIAGNOSIS — M79645 Pain in left finger(s): Secondary | ICD-10-CM

## 2021-02-25 DIAGNOSIS — K219 Gastro-esophageal reflux disease without esophagitis: Secondary | ICD-10-CM | POA: Diagnosis not present

## 2021-02-25 DIAGNOSIS — E785 Hyperlipidemia, unspecified: Secondary | ICD-10-CM

## 2021-02-25 DIAGNOSIS — E669 Obesity, unspecified: Secondary | ICD-10-CM | POA: Diagnosis not present

## 2021-02-25 DIAGNOSIS — Z789 Other specified health status: Secondary | ICD-10-CM

## 2021-02-25 MED ORDER — PANTOPRAZOLE SODIUM 40 MG PO TBEC: DELAYED_RELEASE_TABLET | ORAL | 3 refills | Status: DC

## 2021-02-25 MED ORDER — DOXYCYCLINE HYCLATE 100 MG PO TABS: 100.0000 mg | ORAL_TABLET | Freq: Every day | ORAL | 1 refills | Status: DC

## 2021-02-25 NOTE — Patient Instructions (Addendum)
It was great to see you!  Referral for sports medicine placed today  Please follow-up with dermatology regarding your rosacea and long-term use of doxycycline  Please go to the lab for blood work.   Our office will call you with your results unless you have chosen to receive results via MyChart.  If your blood work is normal we will follow-up each year for physicals and as scheduled for chronic medical problems.  If anything is abnormal we will treat accordingly and get you in for a follow-up.  Take care,  Lelon Mast

## 2021-02-26 ENCOUNTER — Other Ambulatory Visit (INDEPENDENT_AMBULATORY_CARE_PROVIDER_SITE_OTHER): Payer: Commercial Managed Care - PPO

## 2021-02-26 DIAGNOSIS — Z789 Other specified health status: Secondary | ICD-10-CM | POA: Diagnosis not present

## 2021-02-26 DIAGNOSIS — K219 Gastro-esophageal reflux disease without esophagitis: Secondary | ICD-10-CM | POA: Diagnosis not present

## 2021-02-26 DIAGNOSIS — E785 Hyperlipidemia, unspecified: Secondary | ICD-10-CM

## 2021-02-26 DIAGNOSIS — E669 Obesity, unspecified: Secondary | ICD-10-CM

## 2021-02-26 LAB — COMPREHENSIVE METABOLIC PANEL
ALT: 13 U/L (ref 0–35)
AST: 22 U/L (ref 0–37)
Albumin: 4.4 g/dL (ref 3.5–5.2)
Alkaline Phosphatase: 85 U/L (ref 39–117)
BUN: 11 mg/dL (ref 6–23)
CO2: 28 mEq/L (ref 19–32)
Calcium: 10.1 mg/dL (ref 8.4–10.5)
Chloride: 103 mEq/L (ref 96–112)
Creatinine, Ser: 1.16 mg/dL (ref 0.40–1.20)
GFR: 49.68 mL/min — ABNORMAL LOW (ref >60.00)
Glucose, Bld: 95 mg/dL (ref 70–99)
Potassium: 5 mEq/L (ref 3.5–5.1)
Sodium: 141 mEq/L (ref 135–145)
Total Bilirubin: 0.7 mg/dL (ref 0.2–1.2)
Total Protein: 7.1 g/dL (ref 6.0–8.3)

## 2021-02-26 LAB — CBC WITH DIFFERENTIAL/PLATELET
Basophils Absolute: 0 10*3/uL (ref 0.0–0.1)
Basophils Relative: 0.8 % (ref 0.0–3.0)
Eosinophils Absolute: 0.1 10*3/uL (ref 0.0–0.7)
Eosinophils Relative: 1.2 % (ref 0.0–5.0)
HCT: 43.2 % (ref 36.0–46.0)
Hemoglobin: 14.3 g/dL (ref 12.0–15.0)
Lymphocytes Relative: 42.3 % (ref 12.0–46.0)
Lymphs Abs: 2.1 10*3/uL (ref 0.7–4.0)
MCHC: 33.2 g/dL (ref 30.0–36.0)
MCV: 92 fl (ref 78.0–100.0)
Monocytes Absolute: 0.5 10*3/uL (ref 0.1–1.0)
Monocytes Relative: 10.4 % (ref 3.0–12.0)
Neutro Abs: 2.2 10*3/uL (ref 1.4–7.7)
Neutrophils Relative %: 45.3 % (ref 43.0–77.0)
Platelets: 199 10*3/uL (ref 150.0–400.0)
RBC: 4.7 Mil/uL (ref 3.87–5.11)
RDW: 12.5 % (ref 11.5–15.5)
WBC: 4.9 10*3/uL (ref 4.0–10.5)

## 2021-02-26 LAB — LIPID PANEL
Cholesterol: 225 mg/dL — ABNORMAL HIGH (ref 0–200)
HDL: 41.5 mg/dL (ref >39.00)
LDL Cholesterol: 153 mg/dL — ABNORMAL HIGH (ref 0–99)
NonHDL: 183.73
Total CHOL/HDL Ratio: 5
Triglycerides: 153 mg/dL — ABNORMAL HIGH (ref 0.0–149.0)
VLDL: 30.6 mg/dL (ref 0.0–40.0)

## 2021-02-26 LAB — VITAMIN B12: Vitamin B-12: 375 pg/mL (ref 211–911)

## 2021-02-27 NOTE — Progress Notes (Signed)
I, Christoper Fabian, LAT, ATC, am serving as scribe for Dr. Clementeen Graham.  Kaitlin Cortez is a 66 y.o. female who presents to [facility] today for L index finger pain x few months.  She locates her pain to her L index finger PIP joint.  She has been seen x2 by her PCP for this issue.  L index finger swelling: yes Aggravating factors: gripping; L index finger flexion AROM Treatments tried: Tylenol; Voltaren gel   Pertinent review of systems: no fever or chills  Relevant historical information: hx right carpal tunnel release and trigger thumb release in [place] in the past.    Exam:  BP 112/70 (BP Location: Left Arm, Patient Position: Sitting, Cuff Size: Normal)    Pulse 62    Ht 5\' 5"  (1.651 m)    Wt 206 lb (93.4 kg)    SpO2 98%    BMI 34.28 kg/m  General: Well Developed, well nourished, and in no acute distress.   MSK: Left hand normal-appearing Mildly tender palpation second MCP. Triggering present with flexion of PIP joint. Intact strength.  Pulses cap refill and sensation are intact distally.    Lab and Radiology Results  Procedure: Real-time Ultrasound Guided Injection of left second PIP flexor tendon sheath at A1 pulley. (Trigger finger injection) Device: Philips Affiniti 50G Images permanently stored and available for review in PACS Verbal informed consent obtained.  Discussed risks and benefits of procedure. Warned about infection bleeding damage to structures skin hypopigmentation and fat atrophy among others. Patient expresses understanding and agreement Time-out conducted.   Noted no overlying erythema, induration, or other signs of local infection.   Skin prepped in a sterile fashion.   Local anesthesia: Topical Ethyl chloride.   With sterile technique and under real time ultrasound guidance: 20 mg of Kenalog and 0.5 mL of lidocaine injected into tendon sheath. Fluid seen entering the tendon sheath.   Shot was completed however patient  experienced a fair amount of pain during the injection.  Originally I plan to inject 40 mg of Kenalog and 1 mL of lidocaine.  We both agreed to discontinue the injection about early due to pain. Pain immediately resolved suggesting accurate placement of the medication.   Advised to call if fevers/chills, erythema, induration, drainage, or persistent bleeding.   Images permanently stored and available for review in the ultrasound unit.  Impression: Technically successful ultrasound guided injection.        Assessment and Plan: 66 y.o. female with left hand trigger finger second digit.  Plan for injection today along with double Band-Aid splint and Voltaren gel.  Based on her apprehension around injections and pain with injection we probably will be doing this again if this shot does not last very well.  If not doing as well as we would like she will let me know and I may refer directly to hand surgery to discuss surgical options including release.  She is done really well with the contralateral right trigger thumb released in the past.   PDMP not reviewed this encounter. Orders Placed This Encounter  Procedures   77 LIMITED JOINT SPACE STRUCTURES UP LEFT(NO LINKED CHARGES)    Order Specific Question:   Reason for Exam (SYMPTOM  OR DIAGNOSIS REQUIRED)    Answer:   L index finger pain    Order Specific Question:   Preferred imaging location?    Answer:   [facility]   No orders of the defined types were  placed in this encounter.    Discussed warning signs or symptoms. Please see discharge instructions. Patient expresses understanding.   The above documentation has been reviewed and is accurate and complete Clementeen Graham, M.D.

## 2021-02-28 ENCOUNTER — Other Ambulatory Visit: Payer: Self-pay

## 2021-02-28 ENCOUNTER — Ambulatory Visit: Payer: Self-pay

## 2021-02-28 ENCOUNTER — Ambulatory Visit (INDEPENDENT_AMBULATORY_CARE_PROVIDER_SITE_OTHER): Payer: Commercial Managed Care - PPO | Admitting: Family Medicine

## 2021-02-28 ENCOUNTER — Encounter: Payer: Self-pay | Admitting: Family Medicine

## 2021-02-28 VITALS — BP 112/70 | HR 62 | Ht 65.0 in | Wt 206.0 lb

## 2021-02-28 DIAGNOSIS — M65322 Trigger finger, left index finger: Secondary | ICD-10-CM | POA: Diagnosis not present

## 2021-02-28 DIAGNOSIS — M79645 Pain in left finger(s): Secondary | ICD-10-CM | POA: Diagnosis not present

## 2021-02-28 NOTE — Patient Instructions (Addendum)
Good to see you today.  You've had a L index finger injection.  Call or go to the ER if you develop a large red swollen joint with extreme pain or oozing puss.   Use a double band-aid splint to help prevent your finger from triggering.  Please use Voltaren gel (Generic Diclofenac Gel) up to 4x daily for pain as needed.  This is available over-the-counter as both the name brand Voltaren gel and the generic diclofenac gel.   Follow-up: as needed

## 2021-03-27 ENCOUNTER — Encounter: Payer: Self-pay | Admitting: Physician Assistant

## 2021-05-13 ENCOUNTER — Encounter: Payer: Self-pay | Admitting: Cardiology

## 2021-05-22 ENCOUNTER — Other Ambulatory Visit: Payer: Self-pay | Admitting: Physician Assistant

## 2021-05-29 ENCOUNTER — Ambulatory Visit (INDEPENDENT_AMBULATORY_CARE_PROVIDER_SITE_OTHER): Payer: Commercial Managed Care - PPO | Admitting: Physician Assistant

## 2021-05-29 DIAGNOSIS — L570 Actinic keratosis: Secondary | ICD-10-CM | POA: Diagnosis not present

## 2021-05-29 DIAGNOSIS — L2089 Other atopic dermatitis: Secondary | ICD-10-CM | POA: Diagnosis not present

## 2021-05-29 DIAGNOSIS — L719 Rosacea, unspecified: Secondary | ICD-10-CM | POA: Diagnosis not present

## 2021-05-29 DIAGNOSIS — L918 Other hypertrophic disorders of the skin: Secondary | ICD-10-CM

## 2021-05-29 MED ORDER — HYDROCORTISONE BUTYRATE 0.1 % EX CREA: 1.0000 "application " | TOPICAL_CREAM | Freq: Every day | CUTANEOUS | 6 refills | Status: AC

## 2021-05-29 MED ORDER — TRIAMCINOLONE ACETONIDE 0.1 % EX CREA: 1.0000 "application " | TOPICAL_CREAM | Freq: Every day | CUTANEOUS | 2 refills | Status: DC

## 2021-05-29 MED ORDER — METRONIDAZOLE 0.75 % EX CREA: TOPICAL_CREAM | Freq: Two times a day (BID) | CUTANEOUS | 6 refills | Status: DC

## 2021-05-30 ENCOUNTER — Encounter: Payer: Self-pay | Admitting: Physician Assistant

## 2021-05-30 NOTE — Progress Notes (Signed)
? ?  Follow-Up Visit ?  ?Subjective  ?Kaitlin Cortez is a 66 y.o. female who presents for the following: New Patient (Initial Visit) (Here for rosacea on her face. Her PCP gave her doxycycline she has been taking it for few months. She also used a topical but can't remember the name. Also has lesion on nose wont go away. Rash on hand x 2 months. It itches. She has used lotion on it. ). ? ? ?The following portions of the chart were reviewed this encounter and updated as appropriate:  Tobacco  Allergies  Meds  Problems  Med Hx  Surg Hx  Fam Hx   ?  ? ?Objective  ?Well appearing patient in no apparent distress; mood and affect are within normal limits. ? ?All skin waist up examined. ? ?Dorsum of Nose ?Erythematous patches with gritty scale. ? ?Left Buccal Cheek, Left Malar Cheek, Right Buccal Cheek, Right Malar Cheek ?Centrifacial erythema with papules/pustules.  ? ?Left Dorsal Hand ?Thin scaly erythematous plaques.  ? ? ?Assessment & Plan  ?Actinic keratosis ?Dorsum of Nose ? ?Treatment deferred today due to patient going out of town this Friday.  ? ?Rosacea ?Left Malar Cheek; Right Malar Cheek; Left Buccal Cheek; Right Buccal Cheek ? ?Continue Doxycycline 100mg  and add Metronidazole Cream and Hydrocortisone Butyrate Cream mixed together daily. Daily sunscreen of SPF 30 or higher due to sun sensitivity from Doxycycline.  ? ?metroNIDAZOLE (METROCREAM) 0.75 % cream - Left Buccal Cheek, Left Malar Cheek, Right Buccal Cheek, Right Malar Cheek ?Apply topically 2 (two) times daily. ? ?hydrocortisone butyrate (LUCOID) 0.1 % CREA cream - Left Buccal Cheek, Left Malar Cheek, Right Buccal Cheek, Right Malar Cheek ?Apply 1 application. topically daily. ? ?Other atopic dermatitis ?Left Dorsal Hand ? ?triamcinolone cream (KENALOG) 0.1 % - Left Dorsal Hand ?Apply 1 application. topically daily. ? ? ? ?I, Thomson Herbers, PA-C, have reviewed all documentation's for this visit.  The documentation on 05/30/21 for the exam,  diagnosis, procedures and orders are all accurate and complete. ?

## 2021-06-13 ENCOUNTER — Ambulatory Visit: Payer: Commercial Managed Care - PPO | Admitting: Cardiology

## 2021-07-01 ENCOUNTER — Ambulatory Visit: Payer: Commercial Managed Care - PPO | Admitting: Cardiology

## 2021-07-11 NOTE — Progress Notes (Unsigned)
Cardiology Office Note   Date:  07/11/2021   ID:  Kaitlin Cortez, DOB Feb 13, 1955, MRN [id number]  PCP:  Jarold Motto, PA  Cardiologist:   Rollene Rotunda, MD Referring:  Jarold Motto, PA  No chief complaint on file.      History of Present Illness: Kaitlin Cortez is a 66 y.o. female who is referred by Jarold Motto, PA to evaluate for atrial fib.  She has a history of this and moved from [place].  She has been treated with Flecainide.  She said she has had atrial fibrillation for years.  She never really tolerated beta-blockers or apparently calcium channel blockers.  She gets a very low heart rate.    Since I saw her she called in April and she had palpitations and thought she was likely having PAF. ***   *** she has occasional bouts of atrial fibrillation lasting for a few hours.  However, its not as severe as it was when she has been in the hospital twice with.  He goes away if she goes out walking or do some other activities.  She otherwise feels well.  She is not exercising as much as she plans to.  She denies any chest pressure, neck or arm discomfort.  She has had no new shortness of breath, PND or orthopnea   Past Medical History:  Diagnosis Date   GERD (gastroesophageal reflux disease)    Hyperlipidemia    Paroxysmal atrial fibrillation (HCC) 09/28/2018    Past Surgical History:  Procedure Laterality Date   CARPAL TUNNEL RELEASE Bilateral 2015   CHOLECYSTECTOMY  1998   DIAGNOSTIC LAPAROSCOPY WITH REMOVAL OF ECTOPIC PREGNANCY Right 1996     Current Outpatient Medications  Medication Sig Dispense Refill   aspirin EC 81 MG tablet Take 81 mg by mouth daily. Swallow whole.     doxycycline (VIBRA-TABS) 100 MG tablet TAKE 1 TABLET(100 MG) BY MOUTH DAILY 90 tablet 1   flecainide (TAMBOCOR) 50 MG tablet Take 1 tablet by mouth twice daily 180 tablet 3   hydrocortisone butyrate (LUCOID) 0.1 % CREA cream Apply 1 application. topically daily. 45 g 6   loteprednol  (LOTEMAX) 0.5 % ophthalmic suspension 1 drop as needed.     metroNIDAZOLE (METROCREAM) 0.75 % cream Apply topically 2 (two) times daily. 45 g 6   Multiple Vitamin (MULTIVITAMIN) tablet Take 1 tablet by mouth daily.     pantoprazole (PROTONIX) 40 MG tablet TAKE 1 TABLET(40 MG) BY MOUTH AT BEDTIME 90 tablet 3   triamcinolone cream (KENALOG) 0.1 % Apply 1 application. topically daily. 454 g 2   No current facility-administered medications for this visit.    Allergies:   Statins and Versed [midazolam]    ROS:  Please see the history of present illness.   Otherwise, review of systems are positive for ***.   All other systems are reviewed and negative.    PHYSICAL EXAM: VS:  There were no vitals taken for this visit. , BMI There is no height or weight on file to calculate BMI. GENERAL:  Well appearing NECK:  No jugular venous distention, waveform within normal limits, carotid upstroke brisk and symmetric, no bruits, no thyromegaly LUNGS:  Clear to auscultation bilaterally CHEST:  Unremarkable HEART:  PMI not displaced or sustained,S1 and S2 within normal limits, no S3, no S4, no clicks, no rubs, *** murmurs ABD:  Flat, positive bowel sounds normal in frequency in pitch, no bruits, no rebound, no guarding, no midline pulsatile mass, no hepatomegaly, no  splenomegaly EXT:  2 plus pulses throughout, no edema, no cyanosis no clubbing     ***GENERAL:  Well appearing NECK:  No jugular venous distention, waveform within normal limits, carotid upstroke brisk and symmetric, no bruits, no thyromegaly LUNGS:  Clear to auscultation bilaterally CHEST:  Unremarkable HEART:  PMI not displaced or sustained,S1 and S2 within normal limits, no S3, no S4, no clicks, no rubs, no murmurs ABD:  Flat, positive bowel sounds normal in frequency in pitch, no bruits, no rebound, no guarding, no midline pulsatile mass, no hepatomegaly, no splenomegaly EXT:  2 plus pulses throughout, no edema, no cyanosis no  clubbing    EKG:  EKG is *** ordered today. The ekg ordered today demonstrates sinus rhythm, rate ***, axis within normal limits, intervals within normal limits, no acute ST-T wave changes.   Recent Labs: 02/26/2021: ALT 13; BUN 11; Creatinine, Ser 1.16; Hemoglobin 14.3; Platelets 199.0; Potassium 5.0; Sodium 141    Lipid Panel    Component Value Date/Time   CHOL 225 (H) 02/26/2021 0906   TRIG 153.0 (H) 02/26/2021 0906   HDL 41.50 02/26/2021 0906   CHOLHDL 5 02/26/2021 0906   VLDL 30.6 02/26/2021 0906   LDLCALC 153 (H) 02/26/2021 0906      Wt Readings from Last 3 Encounters:  02/28/21 206 lb (93.4 kg)  02/25/21 203 lb 4 oz (92.2 kg)  12/14/20 207 lb (93.9 kg)      Other studies Reviewed: Additional studies/ records that were reviewed today include: ***. Review of the above records demonstrates:  NA   ASSESSMENT AND PLAN:  PAF:   She has brief paroxysms. [name] has a CHA2DS2 - VASc score of 1.  ***  No change in therapy.  We talked about taking an extra flecainide if she has a very prolonged paroxysm.  DYSLIPIDEMIA:   ***  She has not tolerated statins.  She had a calcium score of 0.  I think her lipids can be diet controlled.    Current medicines are reviewed at length with the patient today.  The patient does not have concerns regarding medicines.  The following changes have been made: ***  Labs/ tests ordered today include:  ***  No orders of the defined types were placed in this encounter.     Disposition:   FU with me in 18 months   Signed, Rollene Rotunda, MD  07/11/2021 9:47 PM    [facility]

## 2021-07-12 ENCOUNTER — Encounter: Payer: Self-pay | Admitting: Cardiology

## 2021-07-12 ENCOUNTER — Ambulatory Visit (INDEPENDENT_AMBULATORY_CARE_PROVIDER_SITE_OTHER): Payer: Commercial Managed Care - PPO | Admitting: Cardiology

## 2021-07-12 VITALS — BP 96/60 | HR 56 | Ht 65.0 in | Wt 193.2 lb

## 2021-07-12 DIAGNOSIS — I480 Paroxysmal atrial fibrillation: Secondary | ICD-10-CM | POA: Diagnosis not present

## 2021-07-12 DIAGNOSIS — E785 Hyperlipidemia, unspecified: Secondary | ICD-10-CM

## 2021-07-12 MED ORDER — APIXABAN 5 MG PO TABS: 5.0000 mg | ORAL_TABLET | Freq: Two times a day (BID) | ORAL | 3 refills | Status: DC

## 2021-07-12 MED ORDER — FLECAINIDE ACETATE 50 MG PO TABS: 75.0000 mg | ORAL_TABLET | Freq: Two times a day (BID) | ORAL | 3 refills | Status: DC

## 2021-07-12 MED ORDER — APIXABAN 5 MG PO TABS: 5.0000 mg | ORAL_TABLET | Freq: Two times a day (BID) | ORAL | 0 refills | Status: DC

## 2021-07-12 NOTE — Patient Instructions (Signed)
Medication Instructions:   -Stop taking aspirin  -Increase flecainide (tambocor) to 75mg  twice daily.  -Start taking apixaban (eliquis) 5mg  twice daily.  *If you need a refill on your cardiac medications before your next appointment, please call your pharmacy*    Follow-Up: At [facility], you and your health needs are our priority.  As part of our continuing mission to provide you with exceptional heart care, we have created designated Provider Care Teams.  These Care Teams include your primary Cardiologist (physician) and Advanced Practice Providers (APPs -  Physician Assistants and Nurse Practitioners) who all work together to provide you with the care you need, when you need it.  We recommend signing up for the patient portal called "MyChart".  Sign up information is provided on this After Visit Summary.  MyChart is used to connect with patients for Virtual Visits (Telemedicine).  Patients are able to view lab/test results, encounter notes, upcoming appointments, etc.  Non-urgent messages can be sent to your provider as well.   To learn more about what you can do with MyChart, go to .    Your next appointment:   6 month(s)  The format for your next appointment:   In Person  Provider:   [facility], MD

## 2021-07-22 ENCOUNTER — Telehealth: Payer: Self-pay | Admitting: Physician Assistant

## 2021-07-22 ENCOUNTER — Encounter: Payer: Self-pay | Admitting: Cardiology

## 2021-07-22 NOTE — Telephone Encounter (Signed)
Noted  

## 2021-07-22 NOTE — Telephone Encounter (Addendum)
FYI--Pt called on 07/22/21 to schedule first nurse visit for B12 injections that were advised to be started on 02/26/21. After discussing the next steps for patient with Lupita Leash, I was advised to schedule patient for a follow up visit where the labs will be recollected. I was able to schedule patient on 08/16/21 at 8:40 am.

## 2021-08-16 ENCOUNTER — Ambulatory Visit: Payer: Commercial Managed Care - PPO | Admitting: Physician Assistant

## 2021-08-19 ENCOUNTER — Other Ambulatory Visit: Payer: Self-pay | Admitting: Cardiology

## 2021-08-19 DIAGNOSIS — I480 Paroxysmal atrial fibrillation: Secondary | ICD-10-CM

## 2021-08-19 NOTE — Telephone Encounter (Signed)
Eliquis refilled on 07/12/21.

## 2021-08-20 ENCOUNTER — Ambulatory Visit (INDEPENDENT_AMBULATORY_CARE_PROVIDER_SITE_OTHER): Payer: Commercial Managed Care - PPO | Admitting: Physician Assistant

## 2021-08-20 ENCOUNTER — Encounter: Payer: Self-pay | Admitting: Physician Assistant

## 2021-08-20 VITALS — BP 110/68 | HR 52 | Temp 97.2°F | Ht 65.0 in | Wt 192.0 lb

## 2021-08-20 DIAGNOSIS — E538 Deficiency of other specified B group vitamins: Secondary | ICD-10-CM

## 2021-08-20 DIAGNOSIS — L570 Actinic keratosis: Secondary | ICD-10-CM

## 2021-08-20 DIAGNOSIS — L820 Inflamed seborrheic keratosis: Secondary | ICD-10-CM | POA: Diagnosis not present

## 2021-08-20 DIAGNOSIS — L918 Other hypertrophic disorders of the skin: Secondary | ICD-10-CM

## 2021-08-20 NOTE — Addendum Note (Signed)
Addended by: Lorn Junes on: 08/20/2021 03:00 PM   Modules accepted: Orders

## 2021-08-20 NOTE — Patient Instructions (Signed)
It was great to see you!  Likely start B-12 injections -- I will communicate this via mychart!  Take care,  Jarold Motto PA-C

## 2021-08-20 NOTE — Progress Notes (Signed)
Kaitlin Cortez is a 66 y.o. female here for a follow up on   History of Present Illness:   Chief Complaint  Patient presents with   B 12 deficiency    Pt here today to have her B12 rechecked due to she never started B12 injections.    HPI  B12 deficiency Patient does have hx of this. Since our last visit on 02/25/21, pt has tried taking OTC multivitamin daily which has been helping with fatigue. She has never tried B12 injections for this issue. At this time, she is interested to be started on B12 injections. States she would like to rechecked her B12 levels today as well. Denies any worsening sx. No reported nausea or vomiting.   Past Medical History:  Diagnosis Date   GERD (gastroesophageal reflux disease)    Hyperlipidemia    Paroxysmal atrial fibrillation (HCC) 09/28/2018     Social History   Tobacco Use   Smoking status: Never   Smokeless tobacco: Never  Vaping Use   Vaping Use: Never used  Substance Use Topics   Alcohol use: Yes    Alcohol/week: 1.0 standard drink of alcohol    Types: 1 Glasses of wine per week    Comment: occ   Drug use: Never    Past Surgical History:  Procedure Laterality Date   CARPAL TUNNEL RELEASE Bilateral 2015   CHOLECYSTECTOMY  1998   DIAGNOSTIC LAPAROSCOPY WITH REMOVAL OF ECTOPIC PREGNANCY Right 1996    Family History  Problem Relation Age of Onset   AAA (abdominal aortic aneurysm) Mother    Dementia Father    Colon polyps Father    Throat cancer Father    Colon polyps Sister    Esophageal cancer Neg Hx    Colon cancer Neg Hx    Pancreatic cancer Neg Hx    Stomach cancer Neg Hx     Allergies  Allergen Reactions   Statins Other (See Comments)    myalgias   Versed [Midazolam]     Makes her throw up    Current Medications:   Current Outpatient Medications:    doxycycline (VIBRA-TABS) 100 MG tablet, TAKE 1 TABLET(100 MG) BY MOUTH DAILY, Disp: 90 tablet, Rfl: 1   Fexofenadine HCl (ALLEGRA ALLERGY PO), Take 1 tablet by  mouth daily as needed., Disp: , Rfl:    flecainide (TAMBOCOR) 50 MG tablet, Take 1.5 tablets (75 mg total) by mouth 2 (two) times daily. Take 1 tablet by mouth twice daily, Disp: 270 tablet, Rfl: 3   hydrocortisone butyrate (LUCOID) 0.1 % CREA cream, Apply 1 application. topically daily., Disp: 45 g, Rfl: 6   Multiple Vitamin (MULTIVITAMIN) tablet, Take 1 tablet by mouth daily., Disp: , Rfl:    pantoprazole (PROTONIX) 40 MG tablet, TAKE 1 TABLET(40 MG) BY MOUTH AT BEDTIME, Disp: 90 tablet, Rfl: 3   rivaroxaban (XARELTO) 20 MG TABS tablet, Take 20 mg by mouth daily with supper., Disp: , Rfl:    Review of Systems:   ROS Negative unless otherwise specified per HPI.  Vitals:   Vitals:   08/20/21 0853  BP: 110/68  Pulse: (!) 52  Temp: (!) 97.2 F (36.2 C)  TempSrc: Temporal  SpO2: 97%  Weight: 192 lb (87.1 kg)  Height: 5\' 5"  (1.651 m)     Body mass index is 31.95 kg/m.  Physical Exam:   Physical Exam Vitals and nursing note reviewed.  Constitutional:      General: She is not in acute distress.    Appearance:  She is well-developed. She is not ill-appearing or toxic-appearing.  Cardiovascular:     Rate and Rhythm: Normal rate and regular rhythm.     Pulses: Normal pulses.     Heart sounds: Normal heart sounds, S1 normal and S2 normal.  Pulmonary:     Effort: Pulmonary effort is normal.     Breath sounds: Normal breath sounds.  Skin:    General: Skin is warm and dry.  Neurological:     Mental Status: She is alert.     GCS: GCS eye subscore is 4. GCS verbal subscore is 5. GCS motor subscore is 6.  Psychiatric:        Speech: Speech normal.        Behavior: Behavior normal. Behavior is cooperative.     Assessment and Plan:   B12 deficiency Update B12 and provide recommendations accordingly Due to vegan status and chronic PPI use, will likely require B12 injections   I,Savera Zaman,acting as a scribe for [employer], PA.,have documented all relevant documentation  on the behalf of Jarold Motto, PA,as directed by  Jarold Motto, PA while in the presence of Jarold Motto, [place].   I, Jarold Motto, [place], have reviewed all documentation for this visit. The documentation on 08/20/21 for the exam, diagnosis, procedures, and orders are all accurate and complete.   Jarold Motto, PA-C

## 2021-08-21 LAB — VITAMIN B12: Vitamin B-12: 544 pg/mL (ref 211–911)

## 2021-09-10 ENCOUNTER — Encounter: Payer: Self-pay | Admitting: Cardiology

## 2021-09-10 ENCOUNTER — Other Ambulatory Visit: Payer: Self-pay

## 2021-09-10 MED ORDER — RIVAROXABAN 20 MG PO TABS: 20.0000 mg | ORAL_TABLET | Freq: Every day | ORAL | 5 refills | Status: DC

## 2021-09-11 ENCOUNTER — Encounter: Payer: Self-pay | Admitting: Physician Assistant

## 2021-09-11 NOTE — Progress Notes (Signed)
   Follow-Up Visit   Subjective  Kaitlin Cortez is a 66 y.o. female who presents for the following: Follow-up (Kin tags under arms and lesion on the nose and upper lip biopsy v/s ln2. Waiver to be signed).   The following portions of the chart were reviewed this encounter and updated as appropriate:  Tobacco  Allergies  Meds  Problems  Med Hx  Surg Hx  Fam Hx      Objective  Well appearing patient in no apparent distress; mood and affect are within normal limits.  A full examination was performed including scalp, head, eyes, ears, nose, lips, neck, chest, axillae, abdomen, back, buttocks, bilateral upper extremities, bilateral lower extremities, hands, feet, fingers, toes, fingernails, and toenails. All findings within normal limits unless otherwise noted below.  Right Upper Vermilion Lip Erythematous patches with gritty scale.  Right Nasal Sidewall Stuck-on, crusted, tan-brown papules and plaques.    Assessment & Plan  AK (actinic keratosis) Right Upper Vermilion Lip  Destruction of lesion - Right Upper Vermilion Lip Complexity: simple   Destruction method: cryotherapy   Informed consent: discussed and consent obtained   Timeout:  patient name, date of birth, surgical site, and procedure verified Lesion destroyed using liquid nitrogen: Yes   Cryotherapy cycles:  3 Outcome: patient tolerated procedure well with no complications    Seborrheic keratosis, inflamed Right Nasal Sidewall  Destruction of lesion - Right Nasal Sidewall Complexity: simple   Destruction method: cryotherapy   Informed consent: discussed and consent obtained   Timeout:  patient name, date of birth, surgical site, and procedure verified Lesion destroyed using liquid nitrogen: Yes   Outcome: patient tolerated procedure well with no complications      I, Vedanshi Massaro, PA-C, have reviewed all documentation's for this visit.  The documentation on 09/11/21 for the exam, diagnosis, procedures  and orders are all accurate and complete.

## 2021-11-02 ENCOUNTER — Other Ambulatory Visit: Payer: Self-pay | Admitting: Cardiology

## 2021-11-02 DIAGNOSIS — I480 Paroxysmal atrial fibrillation: Secondary | ICD-10-CM

## 2021-11-04 ENCOUNTER — Encounter: Payer: Self-pay | Admitting: *Deleted

## 2021-11-22 ENCOUNTER — Other Ambulatory Visit: Payer: Self-pay | Admitting: Physician Assistant

## 2022-01-15 NOTE — Progress Notes (Addendum)
Cardiology Office Note   Date:  01/16/2022   ID:  Kaitlin Cortez, DOB Apr 18, 1955, MRN [id number]  PCP:  Kaitlin Motto, PA  Cardiologist:   Rollene Rotunda, MD Referring:  Kaitlin Motto, PA    Chief Complaint  Patient presents with   Atrial Fibrillation       History of Present Illness: Kaitlin Cortez is a 66 y.o. female who is referred by Kaitlin Motto, PA to evaluate for atrial fib.  She has a history of this and moved from [place].  She has been treated with Flecainide.  She said she has had atrial fibrillation for years.  She never really tolerated beta-blockers or apparently calcium channel blockers.  She gets a very low heart rate.    Since I saw her she has had few breakthroughs.  She did not tolerate Eliquis as she had headache so she was switched to Xarelto.  She still getting mild headaches.  She denies any presyncope or syncope.  She has had no new shortness of breath, PND or orthopnea. Mom  Past Medical History:  Diagnosis Date   GERD (gastroesophageal reflux disease)    Hyperlipidemia    Paroxysmal atrial fibrillation (HCC) 09/28/2018    Past Surgical History:  Procedure Laterality Date   CARPAL TUNNEL RELEASE Bilateral 2015   CHOLECYSTECTOMY  1998   DIAGNOSTIC LAPAROSCOPY WITH REMOVAL OF ECTOPIC PREGNANCY Right 1996     Current Outpatient Medications  Medication Sig Dispense Refill   doxycycline (VIBRA-TABS) 100 MG tablet TAKE 1 TABLET(100 MG) BY MOUTH DAILY 90 tablet 1   Fexofenadine HCl (ALLEGRA ALLERGY PO) Take 1 tablet by mouth daily as needed.     flecainide (TAMBOCOR) 50 MG tablet TAKE 1 TABLET(50 MG) BY MOUTH TWICE DAILY (Patient taking differently: Take 75 mg by mouth once. Takes 1.5 tablets daily.) 180 tablet 3   hydrocortisone butyrate (LUCOID) 0.1 % CREA cream Apply 1 application. topically daily. 45 g 6   Multiple Vitamin (MULTIVITAMIN) tablet Take 1 tablet by mouth daily.     pantoprazole (PROTONIX) 40 MG tablet TAKE 1  TABLET(40 MG) BY MOUTH AT BEDTIME 90 tablet 3   rivaroxaban (XARELTO) 20 MG TABS tablet Take 1 tablet (20 mg total) by mouth daily with supper. 30 tablet 5   No current facility-administered medications for this visit.    Allergies:   Statins and Versed [midazolam]    ROS:  Please see the history of present illness.   Otherwise, review of systems are positive for none.   All other systems are reviewed and negative.    PHYSICAL EXAM: VS:  BP 110/68   Pulse 62   Ht 5\' 5"  (1.651 m)   Wt 203 lb 6.4 oz (92.3 kg)   SpO2 98%   BMI 33.85 kg/m  , BMI Body mass index is 33.85 kg/m. GENERAL:  Well appearing NECK:  No jugular venous distention, waveform within normal limits, carotid upstroke brisk and symmetric, no bruits, no thyromegaly LUNGS:  Clear to auscultation bilaterally CHEST:  Unremarkable HEART:  PMI not displaced or sustained,S1 and S2 within normal limits, no S3, no S4, no clicks, no rubs, no murmurs ABD:  Flat, positive bowel sounds normal in frequency in pitch, no bruits, no rebound, no guarding, no midline pulsatile mass, no hepatomegaly, no splenomegaly EXT:  2 plus pulses throughout, no edema, no cyanosis no clubbing   EKG:  EKG is   ordered today. The ekg ordered today demonstrates sinus rhythm, rate 62, axis  within normal limits, intervals within normal limits, no acute ST-T wave changes.  P wave axis is slightly abnormal and could indicate an ectopic atrial rhythm.  There is nonspecific T wave flattening.   Recent Labs: 02/26/2021: ALT 13; BUN 11; Creatinine, Ser 1.16; Hemoglobin 14.3; Platelets 199.0; Potassium 5.0; Sodium 141    Lipid Panel    Component Value Date/Time   CHOL 225 (H) 02/26/2021 0906   TRIG 153.0 (H) 02/26/2021 0906   HDL 41.50 02/26/2021 0906   CHOLHDL 5 02/26/2021 0906   VLDL 30.6 02/26/2021 0906   LDLCALC 153 (H) 02/26/2021 0906      Wt Readings from Last 3 Encounters:  01/16/22 203 lb 6.4 oz (92.3 kg)  08/20/21 192 lb (87.1 kg)   07/12/21 193 lb 3.2 oz (87.6 kg)      Other studies Reviewed: Additional studies/ records that were reviewed today include: Labs. Review of the above records demonstrates: See elsewhere   ASSESSMENT AND PLAN:  PAF:   She has brief paroxysms. [name] has a CHA2DS2 - VASc score of 2.  No change in therapy.     I will check a BMET and magnesium today.     Current medicines are reviewed at length with the patient today.  The patient does not have concerns regarding medicines.  The following changes have been made: None  Labs/ tests ordered today include: None  Orders Placed This Encounter  Procedures   Basic Metabolic Panel (BMET)   Magnesium   EKG 12-Lead      Disposition:   FU with me in 12  months   Signed, Rollene Rotunda, MD  01/16/2022 8:16 AM    [facility]

## 2022-01-16 ENCOUNTER — Encounter: Payer: Self-pay | Admitting: Cardiology

## 2022-01-16 ENCOUNTER — Ambulatory Visit: Payer: Commercial Managed Care - PPO | Attending: Cardiology | Admitting: Cardiology

## 2022-01-16 VITALS — BP 110/68 | HR 62 | Ht 65.0 in | Wt 203.4 lb

## 2022-01-16 DIAGNOSIS — I480 Paroxysmal atrial fibrillation: Secondary | ICD-10-CM

## 2022-01-16 DIAGNOSIS — E785 Hyperlipidemia, unspecified: Secondary | ICD-10-CM

## 2022-01-16 LAB — BASIC METABOLIC PANEL
BUN/Creatinine Ratio: 12 (ref 12–28)
BUN: 13 mg/dL (ref 8–27)
CO2: 21 mmol/L (ref 20–29)
Calcium: 9.9 mg/dL (ref 8.7–10.3)
Chloride: 104 mmol/L (ref 96–106)
Creatinine, Ser: 1.1 mg/dL — ABNORMAL HIGH (ref 0.57–1.00)
Glucose: 93 mg/dL (ref 70–99)
Potassium: 3.7 mmol/L (ref 3.5–5.2)
Sodium: 140 mmol/L (ref 134–144)
eGFR: 56 mL/min/{1.73_m2} — ABNORMAL LOW (ref >59)

## 2022-01-16 LAB — MAGNESIUM: Magnesium: 2 mg/dL (ref 1.6–2.3)

## 2022-01-16 NOTE — Patient Instructions (Addendum)
Medication Instructions:  Your physician recommends that you continue on your current medications as directed. Please refer to the Current Medication list given to you today.  *If you need a refill on your cardiac medications before your next appointment, please call your pharmacy*   Lab Work: Your physician recommends that you have the following labs drawn today: BMET and Magnesium.  If you have labs (blood work) drawn today and your tests are completely normal, you will receive your results only by: MyChart Message (if you have MyChart) OR A paper copy in the mail If you have any lab test that is abnormal or we need to change your treatment, we will call you to review the results.   Testing/Procedures: NONE   Follow-Up: At [facility], you and your health needs are our priority.  As part of our continuing mission to provide you with exceptional heart care, we have created designated Provider Care Teams.  These Care Teams include your primary Cardiologist (physician) and Advanced Practice Providers (APPs -  Physician Assistants and Nurse Practitioners) who all work together to provide you with the care you need, when you need it.  We recommend signing up for the patient portal called "MyChart".  Sign up information is provided on this After Visit Summary.  MyChart is used to connect with patients for Virtual Visits (Telemedicine).  Patients are able to view lab/test results, encounter notes, upcoming appointments, etc.  Non-urgent messages can be sent to your provider as well.   To learn more about what you can do with MyChart, go to [email or web address].    Your next appointment:   1 year(s)  The format for your next appointment:   In Person  Provider:   Rollene Rotunda, MD

## 2022-01-20 ENCOUNTER — Encounter: Payer: Self-pay | Admitting: *Deleted

## 2022-01-23 ENCOUNTER — Encounter: Payer: Self-pay | Admitting: *Deleted

## 2022-02-12 ENCOUNTER — Encounter: Payer: Self-pay | Admitting: Physician Assistant

## 2022-02-12 NOTE — Telephone Encounter (Signed)
Kaitlin Cortez, pt is scheduled to see you Friday for hip pain. She is asking what to take for pain until then?

## 2022-02-14 ENCOUNTER — Ambulatory Visit: Payer: Commercial Managed Care - PPO | Admitting: Physician Assistant

## 2022-03-02 ENCOUNTER — Other Ambulatory Visit: Payer: Self-pay | Admitting: Physician Assistant

## 2022-03-16 ENCOUNTER — Other Ambulatory Visit: Payer: Self-pay | Admitting: Cardiology

## 2022-03-17 NOTE — Telephone Encounter (Signed)
Prescription refill request for Xarelto received.  Indication:afib Last office visit:12/23 Weight:92.3  kg Age:67 Scr:1.1  12/23 CrCl:74.29  ml/min  Prescription refilled

## 2022-05-24 ENCOUNTER — Other Ambulatory Visit: Payer: Self-pay | Admitting: Physician Assistant

## 2022-07-31 ENCOUNTER — Other Ambulatory Visit: Payer: Self-pay | Admitting: Physician Assistant

## 2022-08-15 ENCOUNTER — Ambulatory Visit (INDEPENDENT_AMBULATORY_CARE_PROVIDER_SITE_OTHER): Payer: Commercial Managed Care - PPO | Admitting: Physician Assistant

## 2022-08-15 ENCOUNTER — Encounter: Payer: Self-pay | Admitting: Physician Assistant

## 2022-08-15 VITALS — BP 111/71 | HR 55 | Temp 97.0°F | Ht 65.0 in | Wt 209.2 lb

## 2022-08-15 DIAGNOSIS — K219 Gastro-esophageal reflux disease without esophagitis: Secondary | ICD-10-CM

## 2022-08-15 DIAGNOSIS — R21 Rash and other nonspecific skin eruption: Secondary | ICD-10-CM

## 2022-08-15 DIAGNOSIS — E2839 Other primary ovarian failure: Secondary | ICD-10-CM

## 2022-08-15 DIAGNOSIS — L719 Rosacea, unspecified: Secondary | ICD-10-CM | POA: Diagnosis not present

## 2022-08-15 MED ORDER — KETOCONAZOLE 2 % EX SHAM: 1.0000 | MEDICATED_SHAMPOO | CUTANEOUS | 0 refills | Status: DC

## 2022-08-15 MED ORDER — HYDROXYZINE PAMOATE 25 MG PO CAPS: 25.0000 mg | ORAL_CAPSULE | Freq: Every evening | ORAL | 0 refills | Status: DC | PRN

## 2022-08-15 MED ORDER — PANTOPRAZOLE SODIUM 40 MG PO TBEC: DELAYED_RELEASE_TABLET | ORAL | 1 refills | Status: DC

## 2022-08-15 MED ORDER — KETOCONAZOLE 2 % EX CREA: 1.0000 | TOPICAL_CREAM | Freq: Every day | CUTANEOUS | 0 refills | Status: DC

## 2022-08-15 NOTE — Patient Instructions (Signed)
It was great to see you!  Start topical ketoconazole cream -- apply to affected areas Use the ketoconazole shampoo as body wash next time you shower as well  May take hydroxyzine at night for severe itching Continue allegra  If no improvement, message me and we will likely do a round of oral steroids  I will send new dermatology referral  Take care,  Jarold Motto PA-C

## 2022-08-15 NOTE — Progress Notes (Signed)
Kaitlin Cortez is a 67 y.o. female here for a new problem.  History of Present Illness:   Chief Complaint  Patient presents with   Rash    Possible poison [place]s [place]  On under breast and abdominal area     Rash Patient is complaining of an itchy rash, mentioning that the itchy can keep her up at night. She states that 2 weeks ago on Sunday, 6/23 her husband got poison ivy and was treated. The following day on Monday, they had sex and the patient began to see a rash on her neck which traveled to under both breasts, right wrist, and left side of lower abdomen. Patient reports that her husband took 3 showers before they had intercourse. She manages her sxs with daily allegra and a topical prescription steroid cream.  GERD Patient is complaint with 40 mg protonix with no issues. She is requesting a refill on this medication this visit.  Dermatology Patient is requesting a referral to a dermatologist.     Past Medical History:  Diagnosis Date   GERD (gastroesophageal reflux disease)    Hyperlipidemia    Paroxysmal atrial fibrillation (HCC) 09/28/2018     Social History   Tobacco Use   Smoking status: Never   Smokeless tobacco: Never  Vaping Use   Vaping Use: Never used  Substance Use Topics   Alcohol use: Yes    Alcohol/week: 1.0 standard drink of alcohol    Types: 1 Glasses of wine per week    Comment: occ   Drug use: Never    Past Surgical History:  Procedure Laterality Date   CARPAL TUNNEL RELEASE Bilateral 2015   CHOLECYSTECTOMY  1998   DIAGNOSTIC LAPAROSCOPY WITH REMOVAL OF ECTOPIC PREGNANCY Right 1996    Family History  Problem Relation Age of Onset   AAA (abdominal aortic aneurysm) Mother    Dementia Father    Colon polyps Father    Throat cancer Father    Colon polyps Sister    Esophageal cancer Neg Hx    Colon cancer Neg Hx    Pancreatic cancer Neg Hx    Stomach cancer Neg Hx     Allergies  Allergen Reactions   Statins Other (See Comments)     myalgias   Versed [Midazolam]     Makes her throw up    Current Medications:   Current Outpatient Medications:    doxycycline (VIBRA-TABS) 100 MG tablet, TAKE 1 TABLET(100 MG) BY MOUTH DAILY, Disp: 90 tablet, Rfl: 0   Fexofenadine HCl (ALLEGRA ALLERGY PO), Take 1 tablet by mouth daily as needed., Disp: , Rfl:    flecainide (TAMBOCOR) 50 MG tablet, TAKE 1 TABLET(50 MG) BY MOUTH TWICE DAILY (Patient taking differently: Take 75 mg by mouth once. Takes 1.5 tablets daily.), Disp: 180 tablet, Rfl: 3   hydrocortisone butyrate (LUCOID) 0.1 % CREA cream, Apply 1 application. topically daily., Disp: 45 g, Rfl: 6   hydrOXYzine (VISTARIL) 25 MG capsule, Take 1 capsule (25 mg total) by mouth at bedtime as needed for itching., Disp: 30 capsule, Rfl: 0   ketoconazole (NIZORAL) 2 % cream, Apply 1 Application topically daily., Disp: 15 g, Rfl: 0   [START ON 08/18/2022] ketoconazole (NIZORAL) 2 % shampoo, Apply 1 Application topically 2 (two) times a week., Disp: 120 mL, Rfl: 0   Multiple Vitamin (MULTIVITAMIN) tablet, Take 1 tablet by mouth daily., Disp: , Rfl:    XARELTO 20 MG TABS tablet, TAKE 1 TABLET(20 MG) BY MOUTH DAILY WITH SUPPER,  Disp: 30 tablet, Rfl: 5   pantoprazole (PROTONIX) 40 MG tablet, Take 1 tablet daily, Disp: 90 tablet, Rfl: 1   Review of Systems:   Review of Systems  Skin:  Positive for rash.    Vitals:   Vitals:   08/15/22 1113  BP: 111/71  Pulse: (!) 55  Temp: (!) 97 F (36.1 C)  TempSrc: Temporal  SpO2: 96%  Weight: 209 lb 3.2 oz (94.9 kg)  Height: 5\' 5"  (1.651 m)     Body mass index is 34.81 kg/m.  Physical Exam:   Physical Exam Constitutional:      General: She is not in acute distress.    Appearance: Normal appearance. She is not ill-appearing.  HENT:     Head: Normocephalic and atraumatic.     Right Ear: External ear normal.     Left Ear: External ear normal.  Eyes:     Extraocular Movements: Extraocular movements intact.     Pupils: Pupils are equal,  round, and reactive to light.  Cardiovascular:     Rate and Rhythm: Normal rate and regular rhythm.     Heart sounds: Normal heart sounds. No murmur heard.    No gallop.  Pulmonary:     Effort: Pulmonary effort is normal. No respiratory distress.     Breath sounds: Normal breath sounds. No wheezing or rales.  Skin:    General: Skin is warm and dry.     Comments: Macerated erythematous rash under bilateral breasts and on lower abdomen  Neurological:     Mental Status: She is alert and oriented to person, place, and time.  Psychiatric:        Judgment: Judgment normal.     Assessment and Plan:   Rash and nonspecific skin eruption Unclear etiology Possible candidal intertrigo - but cannot rule out poison ivy as she was secondarily exposed Advised as follows: Start topical ketoconazole cream -- apply to affected areas Use the ketoconazole shampoo as body wash next time you shower as well May take hydroxyzine at night for severe itching Continue allegra If no improvement, message me and we will likely do a round of oral steroids  Gastroesophageal reflux disease, unspecified whether esophagitis present Well controlled Continue protonix 40 mg daily Follow-up prn  Rosacea Dermatology referral  Estrogen deficiency Will order DEXA    I,Verona Buck,acting as a scribe for [employer], PA.,have documented all relevant documentation on the behalf of Jarold Motto, PA,as directed by  Jarold Motto, PA while in the presence of Jarold Motto, [place].  I, Jarold Motto, [place], have reviewed all documentation for this visit. The documentation on 08/15/22 for the exam, diagnosis, procedures, and orders are all accurate and complete.  Jarold Motto, PA-C

## 2022-08-17 ENCOUNTER — Ambulatory Visit (HOSPITAL_COMMUNITY)
Admission: EM | Admit: 2022-08-17 | Discharge: 2022-08-17 | Disposition: A | Discharge status: Home / Self Care | Payer: Commercial Managed Care - PPO | Attending: Emergency Medicine | Admitting: Emergency Medicine

## 2022-08-17 ENCOUNTER — Encounter (HOSPITAL_COMMUNITY): Payer: Self-pay | Admitting: Emergency Medicine

## 2022-08-17 DIAGNOSIS — L259 Unspecified contact dermatitis, unspecified cause: Secondary | ICD-10-CM | POA: Diagnosis not present

## 2022-08-17 MED ORDER — METHYLPREDNISOLONE SODIUM SUCC 125 MG IJ SOLR
60.0000 mg | Freq: Once | INTRAMUSCULAR | Status: AC
  Administered 2022-08-17: 60 mg via INTRAMUSCULAR

## 2022-08-17 MED ORDER — PREDNISONE 10 MG (21) PO TBPK: ORAL_TABLET | Freq: Every day | ORAL | 0 refills | Status: DC

## 2022-08-17 MED ORDER — METHYLPREDNISOLONE SODIUM SUCC 125 MG IJ SOLR
INTRAMUSCULAR | Status: AC
  Filled 2022-08-17: qty 2
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
## 2022-08-17 NOTE — Discharge Instructions (Signed)
Today you are being treated for an inflammatory rash  You have been given an injection of steroids today in the office today to help reduce the inflammatory process that occurs with this rash which will help minimize your itching as well as begin to clear  Starting tomorrow take prednisone every morning with food as directed, to continue the above process  You may continue use of topical calamine or Benadryl cream to help manage itching, you may also continue oral Benadryl  Please avoid long exposures to heat such as a hot steamy shower or being outside as this may cause further irritation to your rash  You may follow-up with his urgent care as needed if symptoms persist or worsen

## 2022-08-17 NOTE — ED Triage Notes (Signed)
Pt had rash for 2 weeks. Went on Friday to PCP bc was getting worse. Pt was prescribed cream and shampoo and take hydroxyzine. It pain and itching. Takes allegra everyday. Hasn't taken Benadryl.

## 2022-08-18 ENCOUNTER — Encounter: Payer: Self-pay | Admitting: Physician Assistant

## 2022-08-21 ENCOUNTER — Ambulatory Visit (INDEPENDENT_AMBULATORY_CARE_PROVIDER_SITE_OTHER)
Admission: RE | Admit: 2022-08-21 | Discharge: 2022-08-21 | Disposition: A | Discharge status: Home / Self Care | Payer: Commercial Managed Care - PPO | Source: Ambulatory Visit | Attending: Physician Assistant | Admitting: Physician Assistant

## 2022-08-21 DIAGNOSIS — E2839 Other primary ovarian failure: Secondary | ICD-10-CM | POA: Diagnosis not present

## 2022-08-29 ENCOUNTER — Other Ambulatory Visit: Payer: Self-pay | Admitting: Physician Assistant

## 2022-09-25 ENCOUNTER — Other Ambulatory Visit: Payer: Self-pay | Admitting: Cardiology

## 2022-09-25 NOTE — Telephone Encounter (Signed)
Prescription refill request for Xarelto received.  Indication: PAF Last office visit: 01/16/22  Daiva Nakayama MD Weight: 92.3kg Age: 67 Scr: 1.10 on 01/16/22  Epic CrCl: 73.30  Based on above findings Xarelto 20mg  daily is the appropriate dose.  Refill approved.

## 2022-11-21 ENCOUNTER — Other Ambulatory Visit: Payer: Self-pay | Admitting: Cardiology

## 2022-11-21 DIAGNOSIS — I480 Paroxysmal atrial fibrillation: Secondary | ICD-10-CM

## 2022-11-26 ENCOUNTER — Other Ambulatory Visit: Payer: Self-pay | Admitting: Physician Assistant

## 2023-01-12 NOTE — Progress Notes (Unsigned)
  Cardiology Office Note:   Date:  01/15/2023  ID:  Kaitlin Cortez, DOB [birth year], MRN [id number] PCP: Jarold Motto, PA   HeartCare Providers Cardiologist:  Rollene Rotunda, MD {  History of Present Illness:   Kaitlin Cortez is a 67 y.o. female who is referred by Jarold Motto, PA to evaluate for atrial fib.  She has a history of this and moved from [place].  She has been treated with Flecainide.  She said she has had atrial fibrillation for years.  She never really tolerated beta-blockers or apparently calcium channel blockers.  She gets a very low heart rate.    Since I saw her she has done very well.  She denies any new cardiovascular symptoms.  She is actually retiring this week.  Her husband and work for another year but then she might move to [place] with him.  His family is there.  She gets occasional palpitations but nothing particularly sustained.  It happens if she might miss her flecainide.  She tolerates her anticoagulation.  She is physically active.  She does some running and yoga and walking and does not bring on any cardiovascular symptoms.  ROS: As stated in the HPI and negative for all other systems.  Studies Reviewed:    EKG:   EKG Interpretation Date/Time:  Thursday January 15 2023 08:34:22 EST Ventricular Rate:  61 PR Interval:  180 QRS Duration:  90 QT Interval:  436 QTC Calculation: 438 R Axis:   51  Text Interpretation: Normal sinus rhythm Nonspecific T wave abnormality Confirmed by Rollene Rotunda ([id number]) on 01/15/2023 8:39:06 AM     Risk Assessment/Calculations:              Physical Exam:   VS:  BP 110/66   Pulse 64   Ht 5\' 5"  (1.651 m)   Wt 214 lb 9.6 oz (97.3 kg)   BMI 35.71 kg/m    Wt Readings from Last 3 Encounters:  01/15/23 214 lb 9.6 oz (97.3 kg)  08/15/22 209 lb 3.2 oz (94.9 kg)  01/16/22 203 lb 6.4 oz (92.3 kg)     GEN: Well nourished, well developed in no acute distress NECK: No JVD; No carotid bruits CARDIAC: RRR,  no murmurs, rubs, gallops RESPIRATORY:  Clear to auscultation without rales, wheezing or rhonchi  ABDOMEN: Soft, non-tender, non-distended, questionable bruit EXTREMITIES:  No edema; No deformity   ASSESSMENT AND PLAN:   PAF:   She has brief paroxysms. Kaitlin Cortez has a CHA2DS2 - VASc score of 2.   No change in therapy.  I have asked her to get a CBC and a basic metabolic profile with her primary provider.  She would like to wait to have this done by her provider.  AAA: She has a first-degree relative and her mom with an abdominal aortic aneurysm.  A questionable bruit.  I will order an abdominal ultrasound.  Risk reduction: She should get a fasting lipid profile when she has labs and again she wants to have this done by her primary provider.   Follow up with me in one year.   Signed, Rollene Rotunda, MD

## 2023-01-15 ENCOUNTER — Ambulatory Visit: Payer: Commercial Managed Care - PPO | Attending: Cardiology | Admitting: Cardiology

## 2023-01-15 VITALS — BP 110/66 | HR 64 | Ht 65.0 in | Wt 214.6 lb

## 2023-01-15 DIAGNOSIS — I480 Paroxysmal atrial fibrillation: Secondary | ICD-10-CM | POA: Diagnosis not present

## 2023-01-15 NOTE — Patient Instructions (Signed)
Medication Instructions:  No changes. *If you need a refill on your cardiac medications before your next appointment, please call your pharmacy*   Lab Work: LIPID, CBC and BMET with next lab draw by your PCP. If you have labs (blood work) drawn today and your tests are completely normal, you will receive your results only by: MyChart Message (if you have MyChart) OR A paper copy in the mail If you have any lab test that is abnormal or we need to change your treatment, we will call you to review the results.   Follow-Up: At [facility], you and your health needs are our priority.  As part of our continuing mission to provide you with exceptional heart care, we have created designated Provider Care Teams.  These Care Teams include your primary Cardiologist (physician) and Advanced Practice Providers (APPs -  Physician Assistants and Nurse Practitioners) who all work together to provide you with the care you need, when you need it.  Your next appointment:   12 month(s)  Provider:   Rollene Rotunda, MD

## 2023-02-24 ENCOUNTER — Other Ambulatory Visit: Payer: Self-pay | Admitting: Physician Assistant

## 2023-03-06 ENCOUNTER — Other Ambulatory Visit: Payer: Self-pay | Admitting: Physician Assistant

## 2023-03-28 ENCOUNTER — Other Ambulatory Visit: Payer: Self-pay | Admitting: Cardiology

## 2023-03-28 DIAGNOSIS — I480 Paroxysmal atrial fibrillation: Secondary | ICD-10-CM

## 2023-03-30 NOTE — Telephone Encounter (Signed)
Prescription refill request for Xarelto received.  Indication:afib Last office visit:12/24 Weight:97.3  kg Age:68 ZOX:WRUEA labs CrCl:needs labs  Prescription refilled

## 2023-04-02 ENCOUNTER — Encounter: Payer: Self-pay | Admitting: Physician Assistant

## 2023-04-02 ENCOUNTER — Encounter: Payer: Self-pay | Admitting: Cardiology

## 2023-04-02 DIAGNOSIS — R0989 Other specified symptoms and signs involving the circulatory and respiratory systems: Secondary | ICD-10-CM

## 2023-04-13 NOTE — Telephone Encounter (Signed)
 Patient identification verified by 2 forms. Marilynn Rail, RN    Called and spoke to patient  Informed patient:   -RN placed incorrect order   -3/4 [place] needs to be canceled   -new order placed, should be outreached for scheduling  RN apologized for any inconvenience  Patient verbalized understanding, no questions at this time

## 2023-04-14 ENCOUNTER — Ambulatory Visit (HOSPITAL_BASED_OUTPATIENT_CLINIC_OR_DEPARTMENT_OTHER): Payer: Commercial Managed Care - PPO

## 2023-04-15 ENCOUNTER — Ambulatory Visit (INDEPENDENT_AMBULATORY_CARE_PROVIDER_SITE_OTHER): Admitting: Physician Assistant

## 2023-04-15 ENCOUNTER — Encounter: Payer: Self-pay | Admitting: Physician Assistant

## 2023-04-15 VITALS — BP 126/70 | HR 57 | Temp 97.3°F | Ht 65.0 in | Wt 213.5 lb

## 2023-04-15 DIAGNOSIS — M79641 Pain in right hand: Secondary | ICD-10-CM

## 2023-04-15 DIAGNOSIS — M79642 Pain in left hand: Secondary | ICD-10-CM | POA: Diagnosis not present

## 2023-04-15 DIAGNOSIS — Z1211 Encounter for screening for malignant neoplasm of colon: Secondary | ICD-10-CM

## 2023-04-15 NOTE — Progress Notes (Signed)
 Kaitlin Cortez is a 68 y.o. female here for a new problem.  History of Present Illness:   Chief Complaint  Patient presents with   Hand Pain    Pt c/o bilateral hand pain, x several months. Pt c/o finger joints on both hands are swollen.   Hand Pain Patient complains of bilateral hand pain that has persisted for the past several months.  She states that she would occasionally experience joint swelling and finger locking as well.    Associated symptoms include lumps on her palm, inability to form a fist, and grip loss.  Denies applying any ointments or taking any medications to help relief symptoms.  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 She was previously see by Dr. Denyse Amass and undergone PT. She is agreeable to a PT referral.   GI referral  Patient is requesting a GI referral to undergo colon cancer screening. Reports a family history of colon and stomach cancer.    Past Medical History:  Diagnosis Date   GERD (gastroesophageal reflux disease)    Hyperlipidemia    Paroxysmal atrial fibrillation (HCC) 09/28/2018     Social History   Tobacco Use   Smoking status: Never   Smokeless tobacco: Never  Vaping Use   Vaping status: Never Used  Substance Use Topics   Alcohol use: Yes    Alcohol/week: 1.0 standard drink of alcohol    Types: 1 Glasses of wine per week    Comment: occ   Drug use: Never    Past Surgical History:  Procedure Laterality Date   CARPAL TUNNEL RELEASE Bilateral 2015   CHOLECYSTECTOMY  1998   DIAGNOSTIC LAPAROSCOPY WITH REMOVAL OF ECTOPIC PREGNANCY Right 1996    Family History  Problem Relation Age of Onset   AAA (abdominal aortic aneurysm) Mother    Dementia Father    Colon polyps Father    Throat cancer Father    Colon polyps Sister    Esophageal cancer Neg Hx    Colon cancer Neg Hx    Pancreatic cancer Neg Hx    Stomach cancer  Neg Hx     Allergies  Allergen Reactions   Statins Other (See Comments)    myalgias   Versed [Midazolam]     Makes her throw up    Current Medications:   Current Outpatient Medications:    doxycycline (VIBRA-TABS) 100 MG tablet, TAKE 1 TABLET(100 MG) BY MOUTH DAILY, Disp: 90 tablet, Rfl: 0   flecainide (TAMBOCOR) 50 MG tablet, TAKE 1 TABLET(50 MG) BY MOUTH TWICE DAILY, Disp: 180 tablet, Rfl: 0   hydrocortisone butyrate (LUCOID) 0.1 % CREA cream, Apply 1 application. topically daily., Disp: 45 g, Rfl: 6   Multiple Vitamin (MULTIVITAMIN) tablet, Take 1 tablet by mouth daily., Disp: , Rfl:    pantoprazole (PROTONIX) 40 MG tablet, TAKE 1 TABLET BY MOUTH DAILY, Disp: 90 tablet, Rfl: 0   rivaroxaban (XARELTO) 20 MG TABS tablet, Take 1 tablet (20 mg total) by mouth daily with supper. Needs labs for Xarelto Refills, come to office.  Thank you, Disp: 30 tablet, Rfl: 0   Review of Systems:   Review of Systems  Musculoskeletal:        +Hand pain  Neurological:  Positive for weakness.    Vitals:   Vitals:   04/15/23 1124  BP: 126/70  Pulse: (!) 57  Temp: (!) 97.3 F (36.3 C)  TempSrc: Temporal  SpO2: 97%  Weight: 213 lb 8 oz (96.8 kg)  Height: 5\' 5"  (1.651 m)     Body mass index is 35.53 kg/m.  Physical Exam:   Physical Exam Vitals and nursing note reviewed.  Constitutional:      General: She is not in acute distress.    Appearance: She is well-developed. She is not ill-appearing or toxic-appearing.  Cardiovascular:     Rate and Rhythm: Normal rate and regular rhythm.     Pulses: Normal pulses.     Heart sounds: Normal heart sounds, S1 normal and S2 normal.  Pulmonary:     Effort: Pulmonary effort is normal.     Breath sounds: Normal breath sounds.  Musculoskeletal:     Comments: Palpable nodules in bilateral palms Reduced flexion and extension of hand due to pain  Skin:    General: Skin is warm and dry.  Neurological:     Mental Status: She is alert.     GCS:  GCS eye subscore is 4. GCS verbal subscore is 5. GCS motor subscore is 6.  Psychiatric:        Speech: Speech normal.        Behavior: Behavior normal. Behavior is cooperative.     Assessment and Plan:   Bilateral hand pain Unclear etiology but suspect multifactorial I think she has some Dupuytren's contractures and generalized osteoarthritis We discussed trialing topical Voltaren gel We have limited options in regards to over-the-counter options due to Xarelto use She would like to return to sports medicine to discuss, as she wants to avoid hand surgery at this time  Special screening for malignant neoplasms, colon Referral for colonoscopy placed    Jarold Motto, PA-C  I,Safa M Kadhim,acting as a scribe for Jarold Motto, PA.,have documented all relevant documentation on the behalf of Jarold Motto, PA,as directed by  Jarold Motto, PA while in the presence of Jarold Motto, [place].   I, Jarold Motto, [place], have reviewed all documentation for this visit. The documentation on 04/15/23 for the exam, diagnosis, procedures, and orders are all accurate and complete.

## 2023-04-23 ENCOUNTER — Other Ambulatory Visit: Payer: Self-pay

## 2023-04-23 ENCOUNTER — Ambulatory Visit: Admitting: Family Medicine

## 2023-04-23 VITALS — BP 142/84 | HR 63 | Ht 65.0 in | Wt 213.0 lb

## 2023-04-23 DIAGNOSIS — M79641 Pain in right hand: Secondary | ICD-10-CM | POA: Diagnosis not present

## 2023-04-23 DIAGNOSIS — M79642 Pain in left hand: Secondary | ICD-10-CM

## 2023-04-23 DIAGNOSIS — M6530 Trigger finger, unspecified finger: Secondary | ICD-10-CM

## 2023-04-23 DIAGNOSIS — M720 Palmar fascial fibromatosis [Dupuytren]: Secondary | ICD-10-CM

## 2023-04-23 MED ORDER — LORAZEPAM 0.5 MG PO TABS: ORAL_TABLET | ORAL | 0 refills | Status: AC

## 2023-04-23 MED ORDER — LIDOCAINE-PRILOCAINE 2.5-2.5 % EX CREA: 1.0000 | TOPICAL_CREAM | CUTANEOUS | 0 refills | Status: AC | PRN

## 2023-04-23 NOTE — Patient Instructions (Addendum)
 Thank you for coming in today.   Take the ativan before the injection. Do a trial run on Saturday or Sunday.  Schedule with me for the injection.   Apply the emla cream on both hands and cover with saran wrap at least 1 hour before the injection.

## 2023-04-23 NOTE — Progress Notes (Signed)
   Rubin Payor, PhD, LAT, ATC acting as a scribe for Clementeen Graham, MD.  Kaitlin Cortez is a 68 y.o. female who presents to [facility] today for bilat hand pain. Pt was previously seen by Dr. Denyse Amass in 2022-23 for L 2nd trigger finger and L de Quervain's tenosynovitis.   Today, pt c/o bilat hand pain ongoing for several months. She notes she will occasionally experience joint swelling and finger "locking." Pt locates pain to 4th finger of her L hand w/ decrease finger flexion. Pt notes nodules over the MCP of both hands. The 2nd finger of her R hand triggered this morning.   Pertinent review of systems: No fevers or nodes  Relevant historical information: Patient has a listed allergy to Versed causing vomiting with moderate sedation.  She thinks she has taken Valium before and did okay. She had a really challenging experience with previous finger injection in the past to my office.   Exam:  BP (!) 142/84   Pulse 63   Ht 5\' 5"  (1.651 m)   Wt 213 lb (96.6 kg)   SpO2 98%   BMI 35.45 kg/m  General: Well Developed, well nourished, and in no acute distress.   MSK: Right hand tiny Dupuytren's contracture present ulnar aspect of palm.  Mildly tender palpation second MCP.  Triggering present with flexion of PIP joint.  Left hand: Tiny Dupuytren's contracture present ulnar aspect of palm over fourth and fifth metacarpals. Tender palpation palmar fourth MCP.  Lacking full flexion of PIP joint with possible triggering present     Diagnostic Limited MSK Ultrasound of: Bilateral hands Tenosynovitis present at right second and left fourth palmar MCP indicating possible trigger finger Impression: Tenosynovitis      Assessment and Plan: 68 y.o. female with bilateral hand pain and triggering.  Patient does have evidence of trigger finger both hands but also has Dupuytren's contracture.  Her Dupuytren's is not bad enough that further treatment at this time is  a lot of sense.  Plan for injection.  She had such a bad experience with previous injections that were going to try to premedicate with Ativan and Emla cream and scheduled for likely next week.  She does have an allergy to Versed causing vomiting.  I think it is okay for her to have a trial run of Ativan over the weekend to make sure she can tolerate it before she comes here to have a shot. For now double Band-Aid splints and heat.  PDMP reviewed during this encounter. Orders Placed This Encounter  Procedures   [place] LIMITED JOINT SPACE STRUCTURES UP BILAT(NO LINKED CHARGES)    Reason for Exam (SYMPTOM  OR DIAGNOSIS REQUIRED):   bilateral hand pain    Preferred imaging location?:   [facility] ordered this encounter  Medications   LORazepam (ATIVAN) 0.5 MG tablet    Sig: 1-2 tabs 30 - 60 min prior to MRI. Do not drive with this medicine.    Dispense:  4 tablet    Refill:  0   lidocaine-prilocaine (EMLA) cream    Sig: Apply 1 Application topically as needed.    Dispense:  30 g    Refill:  0     Discussed warning signs or symptoms. Please see discharge instructions. Patient expresses understanding.   The above documentation has been reviewed and is accurate and complete Clementeen Graham, M.D.

## 2023-04-26 ENCOUNTER — Other Ambulatory Visit: Payer: Self-pay | Admitting: Cardiology

## 2023-04-26 DIAGNOSIS — I480 Paroxysmal atrial fibrillation: Secondary | ICD-10-CM

## 2023-05-05 ENCOUNTER — Ambulatory Visit (HOSPITAL_BASED_OUTPATIENT_CLINIC_OR_DEPARTMENT_OTHER)

## 2023-05-05 DIAGNOSIS — R0989 Other specified symptoms and signs involving the circulatory and respiratory systems: Secondary | ICD-10-CM | POA: Diagnosis not present

## 2023-05-18 ENCOUNTER — Ambulatory Visit (INDEPENDENT_AMBULATORY_CARE_PROVIDER_SITE_OTHER): Payer: Commercial Managed Care - PPO | Admitting: Dermatology

## 2023-05-18 ENCOUNTER — Encounter: Payer: Self-pay | Admitting: Dermatology

## 2023-05-18 VITALS — BP 109/70 | HR 67

## 2023-05-18 DIAGNOSIS — L719 Rosacea, unspecified: Secondary | ICD-10-CM | POA: Diagnosis not present

## 2023-05-18 DIAGNOSIS — B079 Viral wart, unspecified: Secondary | ICD-10-CM

## 2023-05-18 DIAGNOSIS — L814 Other melanin hyperpigmentation: Secondary | ICD-10-CM

## 2023-05-18 MED ORDER — DOXYCYCLINE MONOHYDRATE 50 MG PO CAPS: 50.0000 mg | ORAL_CAPSULE | Freq: Every day | ORAL | 5 refills | Status: DC

## 2023-05-18 NOTE — Patient Instructions (Addendum)
 Hello Kaitlin Cortez,  Thank you for visiting today. Here is a summary of the key instructions:  Medications: - Take doxycycline 50 mg daily - Apply Metrocream as needed for rosacea flares  Skin Care: - Apply sunscreen daily - Consider using Avene Redness Expert or Anti-redness face wash - Try mixing benzoyl peroxide with metronidazole for outbreaks - For dark spots, consider using Excedrin Radiant Tone lightening cream  Wart Treatment: - Apply Vaseline or Aquaphor to the treated area morning and night - Let the area get crusty and scab, then fall off - If uncomfortable, use a Band-Aid  Follow-up: - Return in 2 months if wart is not gone for another treatment - Schedule a full skin check-up every 6 months  Please reach out if you have any questions or concerns.  Warm regards,  Dr. [facility]       Important Information  Due to recent changes in healthcare laws, you may see results of your pathology and/or laboratory studies on MyChart before the doctors have had a chance to review them. We understand that in some cases there may be results that are confusing or concerning to you. Please understand that not all results are received at the same time and often the doctors may need to interpret multiple results in order to provide you with the best plan of care or course of treatment. Therefore, we ask that you please give [place] 2 business days to thoroughly review all your results before contacting the office for clarification. Should we see a critical lab result, you will be contacted sooner.   If You Need Anything After Your Visit  If you have any questions or concerns for your doctor, please call our main line at [phone] If no one answers, please leave a voicemail as directed and we will return your call as soon as possible. Messages left after 4 pm will be answered the following business day.   You may also send [place] a message via MyChart. We typically respond to  MyChart messages within 1-2 business days.  For prescription refills, please ask your pharmacy to contact our office. Our fax number is [phone].  If you have an urgent issue when the clinic is closed that cannot wait until the next business day, you can page your doctor at the number below.    Please note that while we do our best to be available for urgent issues outside of office hours, we are not available 24/7.   If you have an urgent issue and are unable to reach [place], you may choose to seek medical care at your doctor's office, retail clinic, urgent care center, or emergency room.  If you have a medical emergency, please immediately call 911 or go to the emergency department. In the event of inclement weather, please call our main line at [phone] for an update on the status of any delays or closures.  Dermatology Medication Tips: Please keep the boxes that topical medications come in in order to help keep track of the instructions about where and how to use these. Pharmacies typically print the medication instructions only on the boxes and not directly on the medication tubes.   If your medication is too expensive, please contact our office at [phone] or send [place] a message through MyChart.   We are unable to tell what your co-pay for medications will be in advance as this is different depending on your insurance coverage. However, we may be able to find a substitute medication  at lower cost or fill out paperwork to get insurance to cover a needed medication.   If a prior authorization is required to get your medication covered by your insurance company, please allow [place] 1-2 business days to complete this process.  Drug prices often vary depending on where the prescription is filled and some pharmacies may offer cheaper prices.  The website www.goodrx.com contains coupons for medications through different pharmacies. The prices here do not account for what the cost may be with  help from insurance (it may be cheaper with your insurance), but the website can give you the price if you did not use any insurance.  - You can print the associated coupon and take it with your prescription to the pharmacy.  - You may also stop by our office during regular business hours and pick up a GoodRx coupon card.  - If you need your prescription sent electronically to a different pharmacy, notify our office through [facility] or by phone at [phone]

## 2023-05-18 NOTE — Progress Notes (Signed)
 NEW PATIENT VISIT   Subjective  Kaitlin Cortez is a 68 y.o. female who presents for the following:  Rosacea. Takes Doxycycline 100 mg daily, for at least a couple of years. Uses Metronidazole 0.75% cream as needed for flares. Controlled well by this current treatment regimen. Wears hat when out in sun. Needs refills of Doxycycline.  Check spots on face.   Check right side of nose. Hx of ISK treated with LN2 by Denese Finn at [facility] 08/20/2021. Thinks lesion is recurring.    The following portions of the chart were reviewed this encounter and updated as appropriate: medications, allergies, medical history  Review of Systems:  No other skin or systemic complaints except as noted in HPI or Assessment and Plan.  Objective  Well appearing patient in no apparent distress; mood and affect are within normal limits.  Areas Examined: face  Relevant physical exam findings are noted in the Assessment and Plan.    Assessment & Plan     ROSACEA  Exam: Mid face erythema with telangiectasias +/- scattered inflammatory papules.  Chronic condition with duration or expected duration over one year. Currently well-controlled.   Rosacea is a chronic progressive skin condition usually affecting the face of adults, causing redness and/or acne bumps. It is treatable but not curable. It sometimes affects the eyes (ocular rosacea) as well. It may respond to topical and/or systemic medication and can flare with stress, sun exposure, alcohol, exercise, topical steroids (including hydrocortisone/cortisone 10) and some foods.  Daily application of broad spectrum spf 30+ sunscreen to face is recommended to reduce flares.  - Assessment: Patient has a history of rosacea with redness and bumps, worsening after menopause. Current treatment includes daily doxycycline (for approximately two years) and as-needed metronidazole topical application. The patient reports good control with this regimen,  though occasional outbreaks still occur. Known triggers include sun exposure and red wine consumption.  - Plan:    Reduce doxycycline dosage to 50 mg daily for maintenance    Continue metronidazole topical application as needed    Consider adding benzoyl peroxide to metronidazole for enhanced efficacy    Recommend Avene Redness Expert or Anti-redness face wash line for redness control    Emphasize daily sunscreen application    Prescribe refills for Metrocream and doxycycline 50 mg    Instruct patient to take two doxycycline tablets and message via MyTrip if flare occurs    Schedule follow-up every 6 months for full skin check  2. Wart - Assessment: Patient presents with a recurrent wart in the same location as previously treated. Clinical examination reveals finger-like projections consistent with wart morphology, ruling out pre-cancerous or cancerous lesions.  - Plan:    Perform cryotherapy on the wart    Instruct patient to apply Vaseline or Aquaphor to the treated area morning and night    Educate patient on expected healing process: crusting, scabbing, then falling off    Advise patient to return in 2 months if wart persists    Recommend use of Band-Aid if discomfort occurs  Destruction Procedure Note Destruction method: cryotherapy   Informed consent: discussed and consent obtained   Lesion destroyed using liquid nitrogen: Yes   Outcome: patient tolerated procedure well with no complications   Post-procedure details: wound care instructions given   # of Lesions Treated: 1  Prior to procedure, discussed risks of blister formation, small wound, skin dyspigmentation, or rare scar following cryotherapy. Recommend Vaseline ointment to treated areas while healing.   3. Age-related hyperpigmentation -  Assessment: Patient reports concerns about dark spots attributed to aging.  - Plan:    Reinforce importance of daily sunscreen use    Recommend Eucerin Radiant Tone lightening cream  with thiametol for hyperpigmentation treatment  Return for TBSE, Next Available, Rosacea follow up 6 months.  I, Jill Parcell, CMA, am acting as scribe for [employer], DO.   Documentation: I have reviewed the above documentation for accuracy and completeness, and I agree with the above.  Louana Roup, DO

## 2023-05-18 NOTE — Progress Notes (Deleted)
   Follow-Up Visit   Subjective  Kaitlin Cortez is a 68 y.o. female who presents for the following: Rosacea. Takes Doxycycline 100 mg daily. Uses Metronidazole 0.75% cream. Controlled well by this current treatment regimen. Wears hat when out in sun. Needs refills of Doxycycline.  Check spots on face.   Check right side of nose. Hx of ISK treated with LN2 by Saintclair Halsted at [facility] 08/20/2021.   The following portions of the chart were reviewed this encounter and updated as appropriate: medications, allergies, medical history  Review of Systems:  No other skin or systemic complaints except as noted in HPI or Assessment and Plan.  Objective  Well appearing patient in no apparent distress; mood and affect are within normal limits.  Areas Examined: Face  Relevant physical exam findings are noted in the Assessment and Plan.    Assessment & Plan     ROSACEA  Exam: Mid face erythema with telangiectasias +/- scattered inflammatory papules.  ***wellcontrolled vs notatgoal vs flared  Rosacea is a chronic progressive skin condition usually affecting the face of adults, causing redness and/or acne bumps. It is treatable but not curable. It sometimes affects the eyes (ocular rosacea) as well. It may respond to topical and/or systemic medication and can flare with stress, sun exposure, alcohol, exercise, topical steroids (including hydrocortisone/cortisone 10) and some foods.  Daily application of broad spectrum spf 30+ sunscreen to face is recommended to reduce flares.  Treatment Plan: ***    No follow-ups on file.  I, Lawson Radar, CMA, am acting as scribe for [employer], DO.   Documentation: I have reviewed the above documentation for accuracy and completeness, and I agree with the above.  Langston Reusing, DO

## 2023-05-26 ENCOUNTER — Encounter: Payer: Self-pay | Admitting: Physician Assistant

## 2023-05-27 ENCOUNTER — Ambulatory Visit (HOSPITAL_COMMUNITY)
Admission: EM | Admit: 2023-05-27 | Discharge: 2023-05-27 | Disposition: A | Discharge status: Home / Self Care | Attending: Sports Medicine | Admitting: Sports Medicine

## 2023-05-27 ENCOUNTER — Ambulatory Visit (INDEPENDENT_AMBULATORY_CARE_PROVIDER_SITE_OTHER)

## 2023-05-27 ENCOUNTER — Encounter (HOSPITAL_COMMUNITY): Payer: Self-pay

## 2023-05-27 DIAGNOSIS — M79675 Pain in left toe(s): Secondary | ICD-10-CM | POA: Diagnosis not present

## 2023-05-27 DIAGNOSIS — M722 Plantar fascial fibromatosis: Secondary | ICD-10-CM | POA: Diagnosis not present

## 2023-05-27 NOTE — Discharge Instructions (Addendum)
 You have plantar fasciitis and metatarsalgia of the 1st toe. Take tylenol and ice for 15-20 minutes as needed for pain . Plantar fascia stretch for 20-30 seconds (do 3 of these) in morning. Rolling it with a frozen water bottle is effective for both cooling and stretching. Lowering/raise on a step exercises 3 x 10 once or twice a day - this is very important for long term recovery. Avoid flat shoes/barefoot walking as much as possible. Your Birkenstocks are probably the best thing to wear for now.  I recommend follow-up with the [facility] Address: [address] [place], [place], [place] [address] Phone: [phone] Call and schedule a follow-up. We can try temporary inserts with metatarsal pads or consider custom orthotics if you are interested. Over the counter inserts/orthotics can be tried in the meantime, such as Dr. Christiana Cower active series or Spencos. --Try to find something with good arch support and metatarsal pads if you look for this online.

## 2023-05-27 NOTE — ED Provider Notes (Signed)
 [facility]    CSN: [id number] Arrival date & time: 05/27/23  1016      History   Chief Complaint No chief complaint on file.   HPI Kaitlin Cortez is a 68 y.o. female here with 1 week of left foot pain. She locates most of the pain at the base of the great toe primarily on the plantar aspect of the toe. She also has longstanding pain in the base of the left heel that has flared up this week as well. Denies any injury, but has been moving out of their current house this week requiring a lot of walking up and down stairs which she thinks triggered this. Her pain was worse a couple days ago, but she transitioned to birkenstocks yesterday and it isn't as bothersome today. Endorses + swelling in the forefoot as well, but denies redness, bruising, or tenderness to light touch. No h/o gout. Heel pain worse with first step of the day and improves throughout the day.  Has been taking tylenol without much relief. Cannot take NSAIDs 2/2 to being on Xarelto for Afib.  HPI  Past Medical History:  Diagnosis Date   GERD (gastroesophageal reflux disease)    Hyperlipidemia    Paroxysmal atrial fibrillation (HCC) 09/28/2018    Patient Active Problem List   Diagnosis Date Noted   Dupuytren contracture of both hands 04/23/2023   Trigger finger, acquired 04/23/2023   Educated about COVID-19 virus infection 10/03/2019   Renal insufficiency 02/16/2019   Paroxysmal atrial fibrillation (HCC) 09/28/2018   Bilateral hearing loss 09/28/2018   Statin intolerance 09/28/2018   Hyperlipidemia    GERD (gastroesophageal reflux disease)    Rosacea     Past Surgical History:  Procedure Laterality Date   CARPAL TUNNEL RELEASE Bilateral 2015   CHOLECYSTECTOMY  1998   DIAGNOSTIC LAPAROSCOPY WITH REMOVAL OF ECTOPIC PREGNANCY Right 1996    OB History   No obstetric history on file.      Home Medications    Prior to Admission medications   Medication Sig Start Date End Date Taking?  Authorizing Provider  doxycycline (MONODOX) 50 MG capsule Take 1 capsule (50 mg total) by mouth daily. Take with food. 05/18/23  Yes Dellar Fenton, DO  flecainide (TAMBOCOR) 50 MG tablet Take 1 tablet (50 mg total) by mouth 2 (two) times daily. 04/28/23  Yes Hochrein, Royston Cornea, MD  hydrocortisone butyrate (LUCOID) 0.1 % CREA cream Apply 1 application. topically daily. 05/29/21  Yes Sheffield, Kelli R, PA-C  LORazepam (ATIVAN) 0.5 MG tablet 1-2 tabs 30 - 60 min prior to MRI. Do not drive with this medicine. 04/23/23  Yes Corey, Evan S, MD  Multiple Vitamin (MULTIVITAMIN) tablet Take 1 tablet by mouth daily.   Yes [provider]  pantoprazole (PROTONIX) 40 MG tablet TAKE 1 TABLET BY MOUTH DAILY 02/24/23  Yes Alexander Iba, PA  rivaroxaban (XARELTO) 20 MG TABS tablet Take 1 tablet (20 mg total) by mouth daily with supper. Needs labs for Xarelto Refills, come to office.  Thank you 03/30/23  Yes Eilleen Grates, MD  lidocaine-prilocaine (EMLA) cream Apply 1 Application topically as needed. 04/23/23   Syliva Even, MD    Family History Family History  Problem Relation Age of Onset   AAA (abdominal aortic aneurysm) Mother    Dementia Father    Colon polyps Father    Throat cancer Father    Colon polyps Sister    Esophageal cancer Neg Hx    Colon cancer Neg Hx  Pancreatic cancer Neg Hx    Stomach cancer Neg Hx     Social History Social History   Tobacco Use   Smoking status: Never   Smokeless tobacco: Never  Vaping Use   Vaping status: Never Used  Substance Use Topics   Alcohol use: Yes    Alcohol/week: 1.0 standard drink of alcohol    Types: 1 Glasses of wine per week    Comment: occ   Drug use: Never     Allergies   Statins and Versed [midazolam]   Review of Systems Review of Systems   Physical Exam Triage Vital Signs ED Triage Vitals [05/27/23 1117]  Encounter Vitals Group     BP (!) 143/86     Systolic BP Percentile      Diastolic BP Percentile      Pulse  Rate 63     Resp 18     Temp 98 F (36.7 C)     Temp Source Oral     SpO2 98 %     Weight      Height      Head Circumference      Peak Flow      Pain Score      Pain Loc      Pain Education      Exclude from Growth Chart    No data found.  Updated Vital Signs BP (!) 143/86 (BP Location: Left Arm)   Pulse 63   Temp 98 F (36.7 C) (Oral)   Resp 18   SpO2 98%   Physical Exam Constitutional:      General: She is not in acute distress.    Appearance: Normal appearance.  HENT:     Head: Normocephalic and atraumatic.  Musculoskeletal:     Comments: Left Foot and Ankle Exam: No visible erythema. + mild swelling throughout the forefoot.  Fairly well preserve long arches, moderate collapse of transverse arches. ROM full at ankle and toes, no significant limitation in great toe flexion/extension. Strength full in all directions. TTP at the 1st MT head on the plantar surface and at the medial origin of the plantar fascia of the calcaneus. Remainder of ankle/foot/metatarsal heads non-tender. Negative Windlass. Dorsalis pedis and tibialis pulses 2+.  NVI distally.  Neurological:     Mental Status: She is alert.      UC Treatments / Results  Labs (all labs ordered are listed, but only abnormal results are displayed) Labs Reviewed - No data to display  EKG   Radiology No results found.  Procedures Procedures (including critical care time)  Medications Ordered in UC Medications - No data to display  Initial Impression / Assessment and Plan / UC Course  I have reviewed the triage vital signs and the nursing notes.  Pertinent labs & imaging results that were available during my care of the patient were reviewed by me and considered in my medical decision making (see chart for details).    Vitals and triage reviewed, patient is hemodynamically stable.  Patient is well-appearing, normotensive, afebrile, not tachycardic, not tachypneic, oxygenating well on room air.    Pain of left great toe - Plan: DG Foot Complete Left, DG Foot Complete Left  Plantar fasciitis of left foot - Plan: DG Foot Complete Left, DG Foot Complete Left Overall, vitals and exam are reassuring. Presentation most consistent with metatarsalgia of the 1st MT head vs sesamoiditis as well as acute on chronic plantar fasciitis. Low clinical suspicion for goat. X-rays obtained today  and show chronic heel spur and some TMT joint OA, though no acute bony abnormalities noted. Recommended tylenol 1g TID, icing, plantar fascia and heel cord stretches Recommended continuing use of her birkenstocks as these have functional metatarsal pads to overload pressure from the 1st MT head/sesamoids. Recommended follow-up with [facility] for inserts w/ scaphoid pads and metatarsal vs dancers pads and consideration of custom orthotics in the future if these are beneficial.  Patient's questions were answered and they are in agreement with this plan  Final Clinical Impressions(s) / UC Diagnoses   Final diagnoses:  Pain of left great toe  Plantar fasciitis of left foot     Discharge Instructions      You have plantar fasciitis and metatarsalgia of the 1st toe. Take tylenol and ice for 15-20 minutes as needed for pain . Plantar fascia stretch for 20-30 seconds (do 3 of these) in morning. Rolling it with a frozen water bottle is effective for both cooling and stretching. Lowering/raise on a step exercises 3 x 10 once or twice a day - this is very important for long term recovery. Avoid flat shoes/barefoot walking as much as possible. Your Birkenstocks are probably the best thing to wear for now.  I recommend follow-up with the [facility] Address: [address] [place], [place], [place] [address] Phone: [phone] Call and schedule a follow-up. We can try temporary inserts with metatarsal pads or consider custom orthotics if you are interested. Over the counter  inserts/orthotics can be tried in the meantime, such as Dr. Christiana Cower active series or Spencos. --Try to find something with good arch support and metatarsal pads if you look for this online.      ED Prescriptions   None    PDMP not reviewed this encounter.   Marliss Simple, MD 05/27/23 [phone]

## 2023-05-27 NOTE — ED Triage Notes (Signed)
 Patient presents with left big toe pain x 1 week. Denies any recent injuries. Patient states she was moving furniture this weekend.

## 2023-05-31 ENCOUNTER — Other Ambulatory Visit: Payer: Self-pay | Admitting: Physician Assistant

## 2023-07-27 ENCOUNTER — Encounter: Payer: Self-pay | Admitting: Dermatology

## 2023-07-27 ENCOUNTER — Ambulatory Visit (INDEPENDENT_AMBULATORY_CARE_PROVIDER_SITE_OTHER): Admitting: Dermatology

## 2023-07-27 VITALS — BP 97/66

## 2023-07-27 DIAGNOSIS — D1801 Hemangioma of skin and subcutaneous tissue: Secondary | ICD-10-CM

## 2023-07-27 DIAGNOSIS — L578 Other skin changes due to chronic exposure to nonionizing radiation: Secondary | ICD-10-CM

## 2023-07-27 DIAGNOSIS — L814 Other melanin hyperpigmentation: Secondary | ICD-10-CM | POA: Diagnosis not present

## 2023-07-27 DIAGNOSIS — L719 Rosacea, unspecified: Secondary | ICD-10-CM

## 2023-07-27 DIAGNOSIS — D225 Melanocytic nevi of trunk: Secondary | ICD-10-CM | POA: Diagnosis not present

## 2023-07-27 DIAGNOSIS — L821 Other seborrheic keratosis: Secondary | ICD-10-CM

## 2023-07-27 DIAGNOSIS — L820 Inflamed seborrheic keratosis: Secondary | ICD-10-CM | POA: Diagnosis not present

## 2023-07-27 DIAGNOSIS — Z1283 Encounter for screening for malignant neoplasm of skin: Secondary | ICD-10-CM | POA: Diagnosis not present

## 2023-07-27 DIAGNOSIS — D485 Neoplasm of uncertain behavior of skin: Secondary | ICD-10-CM

## 2023-07-27 DIAGNOSIS — W908XXA Exposure to other nonionizing radiation, initial encounter: Secondary | ICD-10-CM

## 2023-07-27 DIAGNOSIS — D229 Melanocytic nevi, unspecified: Secondary | ICD-10-CM

## 2023-07-27 DIAGNOSIS — D492 Neoplasm of unspecified behavior of bone, soft tissue, and skin: Secondary | ICD-10-CM

## 2023-07-27 MED ORDER — IVERMECTIN 1 % EX CREA
TOPICAL_CREAM | CUTANEOUS | 5 refills | Status: AC
Start: 2023-07-27 — End: ?

## 2023-07-27 NOTE — Patient Instructions (Addendum)
 Date: Mon Jul 27 2023  Hello Kaitlin Cortez,  Thank you for visiting today. Here is a summary of the key instructions:  - Medications For Rosacea:   - Increase Doxycycline  to twice a day until the rosacea papule on your nose goes away   - Continue using metronidazole  cream twice daily   - Start using Soolantra cream twice daily, mixed with metronidazole  cream  - Skin Care:   - If the bump on your nose doesn't go away with increased Doxycycline , contact the office for possible biopsy  - Follow-up:   - Wait for biopsy results, which should be available in about a week   - If biopsy results show cancer, you will be referred to Dr. Pasi for further treatment  - Procedures:   - Two skin biopsies performed today   - Keep biopsy sites clean and dry   - Biopsy sites will heal like a brush burn, filling in and smoothing over time  Please reach out if you have any questions or concerns.  Warm regards,  Dr. Louana Roup, Dermatology   Patient Handout: Wound Care for Skin Biopsy Site  Taking Care of Your Skin Biopsy Site  Proper care of the biopsy site is essential for promoting healing and minimizing scarring. This handout provides instructions on how to care for your biopsy site to ensure optimal recovery.  1. Cleaning the Wound:  Clean the biopsy site daily with gentle soap and water. Gently pat the area dry with a clean, soft towel. Avoid harsh scrubbing or rubbing the area, as this can irritate the skin and delay healing.  2. Applying Aquaphor and Bandage:  After cleaning the wound, apply a thin layer of Aquaphor ointment to the biopsy site. Cover the area with a sterile bandage to protect it from dirt, bacteria, and friction. Change the bandage daily or as needed if it becomes soiled or wet.  3. Continued Care for One Week:  Repeat the cleaning, Aquaphor application, and bandaging process daily for one week following the biopsy procedure. Keeping the wound clean and moist during  this initial healing period will help prevent infection and promote optimal healing.  4. Massaging Aquaphor into the Area:  ---After one week, discontinue the use of bandages but continue to apply Aquaphor to the biopsy site. ----Gently massage the Aquaphor into the area using circular motions. ---Massaging the skin helps to promote circulation and prevent the formation of scar tissue.   Additional Tips:  Avoid exposing the biopsy site to direct sunlight during the healing process, as this can cause hyperpigmentation or worsen scarring. If you experience any signs of infection, such as increased redness, swelling, warmth, or drainage from the wound, contact your healthcare provider immediately. Follow any additional instructions provided by your healthcare provider for caring for the biopsy site and managing any discomfort. Conclusion:  Taking proper care of your skin biopsy site is crucial for ensuring optimal healing and minimizing scarring. By following these instructions for cleaning, applying Aquaphor, and massaging the area, you can promote a smooth and successful recovery. If you have any questions or concerns about caring for your biopsy site, don't hesitate to contact your healthcare provider for guidance.     Important Information  Due to recent changes in healthcare laws, you may see results of your pathology and/or laboratory studies on MyChart before the doctors have had a chance to review them. We understand that in some cases there may be results that are confusing or concerning to you. Please  understand that not all results are received at the same time and often the doctors may need to interpret multiple results in order to provide you with the best plan of care or course of treatment. Therefore, we ask that you please give us  2 business days to thoroughly review all your results before contacting the office for clarification. Should we see a critical lab result, you will be  contacted sooner.   If You Need Anything After Your Visit  If you have any questions or concerns for your doctor, please call our main line at [phone] If no one answers, please leave a voicemail as directed and we will return your call as soon as possible. Messages left after 4 pm will be answered the following business day.   You may also send us  a message via MyChart. We typically respond to MyChart messages within 1-2 business days.  For prescription refills, please ask your pharmacy to contact our office. Our fax number is [phone].  If you have an urgent issue when the clinic is closed that cannot wait until the next business day, you can page your doctor at the number below.    Please note that while we do our best to be available for urgent issues outside of office hours, we are not available 24/7.   If you have an urgent issue and are unable to reach us , you may choose to seek medical care at your doctor's office, retail clinic, urgent care center, or emergency room.  If you have a medical emergency, please immediately call 911 or go to the emergency department. In the event of inclement weather, please call our main line at [phone] for an update on the status of any delays or closures.  Dermatology Medication Tips: Please keep the boxes that topical medications come in in order to help keep track of the instructions about where and how to use these. Pharmacies typically print the medication instructions only on the boxes and not directly on the medication tubes.   If your medication is too expensive, please contact our office at [phone] or send us  a message through MyChart.   We are unable to tell what your co-pay for medications will be in advance as this is different depending on your insurance coverage. However, we may be able to find a substitute medication at lower cost or fill out paperwork to get insurance to cover a needed medication.   If a prior  authorization is required to get your medication covered by your insurance company, please allow us  1-2 business days to complete this process.  Drug prices often vary depending on where the prescription is filled and some pharmacies may offer cheaper prices.  The website www.goodrx.com contains coupons for medications through different pharmacies. The prices here do not account for what the cost may be with help from insurance (it may be cheaper with your insurance), but the website can give you the price if you did not use any insurance.  - You can print the associated coupon and take it with your prescription to the pharmacy.  - You may also stop by our office during regular business hours and pick up a GoodRx coupon card.  - If you need your prescription sent electronically to a different pharmacy, notify our office through [facility] or by phone at [phone]

## 2023-07-27 NOTE — Progress Notes (Signed)
 Total Body Skin Exam (TBSE) Visit   Subjective  Kaitlin Cortez is a 68 y.o. female who presents for the following: Skin Cancer Screening and Full Body Skin Exam  Patient presents today for follow up visit for TBSE. Patient was last evaluated on 05/18/23 for rosacea & PIH.Patient denies medication changes. Patient reports she does have spots, moles and lesions of concern to be evaluated. Patient reports throughout her lifetime she has had complete sun exposure but not shw would consider it minimal. Currently, patient reports if she has excessive sun exposure, she does apply sunscreen and/or wears protective coverings. Patient reports she has hx of bx (benign). Patient denied family history of skin cancers.  The following portions of the chart were reviewed this encounter and updated as appropriate: medications, allergies, medical history  Review of Systems:  No other skin or systemic complaints except as noted in HPI or Assessment and Plan.  Objective  Well appearing patient in no apparent distress; mood and affect are within normal limits.  A full examination was performed including scalp, head, eyes, ears, nose, lips, neck, chest, axillae, abdomen, back, buttocks, bilateral upper extremities, bilateral lower extremities, hands, feet, fingers, toes, fingernails, and toenails. All findings within normal limits unless otherwise noted below.   Relevant physical exam findings are noted in the Assessment and Plan.       Right Abdomen (side) - Lower 6mm irregular dark brown macule  Left Inguinal Area 6mm irregular brown macule   Assessment & Plan   LENTIGINES, SEBORRHEIC KERATOSES, HEMANGIOMAS - Benign normal skin lesions - Benign-appearing - Call for any changes  MELANOCYTIC NEVI - Tan-brown and/or pink-flesh-colored symmetric macules and papules - Benign appearing on exam today - Observation - Call clinic for new or changing moles - Recommend daily use of broad spectrum spf 30+  sunscreen to sun-exposed areas.   ACTINIC DAMAGE - Chronic condition, secondary to cumulative UV/sun exposure - diffuse scaly erythematous macules with underlying dyspigmentation - Recommend daily broad spectrum sunscreen SPF 30+ to sun-exposed areas, reapply every 2 hours as needed.  - Staying in the shade or wearing long sleeves, sun glasses (UVA+UVB protection) and wide brim hats (4-inch brim around the entire circumference of the hat) are also recommended for sun protection.  - Call for new or changing lesions.  ROSACEA Exam: 2 rosacea papules on the nose.   flared  Treatment Plan - Will double dose of Doxycycline  for flares.    SKIN CANCER SCREENING PERFORMED TODAY.   NEOPLASM OF UNCERTAIN BEHAVIOR OF SKIN (2) Right Abdomen (side) - Lower Skin / nail biopsy Type of biopsy: tangential   Informed consent: discussed and consent obtained   Timeout: patient name, date of birth, surgical site, and procedure verified   Procedure prep:  Patient was prepped and draped in usual sterile fashion Prep type:  Isopropyl alcohol Anesthesia: the lesion was anesthetized in a standard fashion   Anesthetic:  1% lidocaine  w/ epinephrine 1-100,000 buffered w/ 8.4% NaHCO3 Instrument used: DermaBlade   Hemostasis achieved with: aluminum chloride   Outcome: patient tolerated procedure well   Post-procedure details: sterile dressing applied and wound care instructions given   Dressing type: petrolatum gauze and bandage   Left Inguinal Area Skin / nail biopsy Type of biopsy: tangential   Informed consent: discussed and consent obtained   Timeout: patient name, date of birth, surgical site, and procedure verified   Procedure prep:  Patient was prepped and draped in usual sterile fashion Prep type:  Isopropyl alcohol Anesthesia:  the lesion was anesthetized in a standard fashion   Anesthetic:  1% lidocaine  w/ epinephrine 1-100,000 buffered w/ 8.4% NaHCO3 Instrument used: DermaBlade   Hemostasis  achieved with: aluminum chloride   Outcome: patient tolerated procedure well   Post-procedure details: sterile dressing applied and wound care instructions given   Dressing type: petrolatum gauze and bandage   Return in about 1 year (around 07/26/2024) for TBSE.   Documentation: I have reviewed the above documentation for accuracy and completeness, and I agree with the above.   I, Shirron Louanne Roussel, CMA, am acting as scribe for [employer], DO.   Louana Roup, DO

## 2023-07-29 LAB — SURGICAL PATHOLOGY

## 2023-07-30 ENCOUNTER — Ambulatory Visit: Payer: Self-pay | Admitting: Dermatology

## 2023-08-23 ENCOUNTER — Other Ambulatory Visit: Payer: Self-pay | Admitting: Cardiology

## 2023-08-24 NOTE — Telephone Encounter (Addendum)
 Prescription refill request for Xarelto  received.  Indication: PAF Last office visit: 01/15/23  JINNY Schilling MD Weight: 97.3kg Age: 68 Scr:  1.10 on 01/16/22  Epic CrCl: 76.23  Based on above findings Xarelto  20mg  daily is the appropriate dose. Pt is past due for lab work.  Requested CBC/BMP at upcoming appt with Dr Schilling.   Refill approved.

## 2023-08-30 ENCOUNTER — Other Ambulatory Visit: Payer: Self-pay | Admitting: Physician Assistant

## 2023-09-01 ENCOUNTER — Encounter: Payer: Self-pay | Admitting: Physician Assistant

## 2023-09-01 DIAGNOSIS — M79672 Pain in left foot: Secondary | ICD-10-CM

## 2023-09-10 NOTE — Progress Notes (Unsigned)
    Kaitlin Cortez D.[facility] [address] [place] [address] Phone: [phone]   Assessment and Plan:     There are no diagnoses linked to this encounter.  ***   Pertinent previous records reviewed include ***    Follow Up: ***     Subjective:   I, Kaitlin Cortez, am serving as a Neurosurgeon for Doctor Morene Mace  Chief Complaint: left foot pain   HPI:   09/11/2023 Patient is a 68 year old female with left foot pain. Patient states   Relevant Historical Information: ***  Additional pertinent review of systems negative.   Current Outpatient Medications:    doxycycline  (MONODOX ) 50 MG capsule, Take 1 capsule (50 mg total) by mouth daily. Take with food., Disp: 30 capsule, Rfl: 5   flecainide  (TAMBOCOR ) 50 MG tablet, Take 1 tablet (50 mg total) by mouth 2 (two) times daily., Disp: 180 tablet, Rfl: 2   hydrocortisone  butyrate (LUCOID) 0.1 % CREA cream, Apply 1 application. topically daily., Disp: 45 g, Rfl: 6   Ivermectin  (SOOLANTRA ) 1 % CREA, Apply to face BID morning and night, Disp: 45 g, Rfl: 5   lidocaine -prilocaine  (EMLA ) cream, Apply 1 Application topically as needed., Disp: 30 g, Rfl: 0   LORazepam  (ATIVAN ) 0.5 MG tablet, 1-2 tabs 30 - 60 min prior to MRI. Do not drive with this medicine., Disp: 4 tablet, Rfl: 0   Multiple Vitamin (MULTIVITAMIN) tablet, Take 1 tablet by mouth daily., Disp: , Rfl:    pantoprazole  (PROTONIX ) 40 MG tablet, TAKE 1 TABLET BY MOUTH DAILY, Disp: 30 tablet, Rfl: 0   rivaroxaban  (XARELTO ) 20 MG TABS tablet, TAKE 1 TABLET(20 MG) BY MOUTH DAILY WITH SUPPER, Disp: 30 tablet, Rfl: 5   Objective:     There were no vitals filed for this visit.    There is no height or weight on file to calculate BMI.    Physical Exam:    ***   Electronically signed by:  Odis Mace D.[facility] 7:44 AM 09/10/23

## 2023-09-11 ENCOUNTER — Ambulatory Visit (INDEPENDENT_AMBULATORY_CARE_PROVIDER_SITE_OTHER): Admitting: Sports Medicine

## 2023-09-11 VITALS — HR 52 | Ht 65.0 in | Wt 208.0 lb

## 2023-09-11 DIAGNOSIS — M79675 Pain in left toe(s): Secondary | ICD-10-CM | POA: Diagnosis not present

## 2023-09-11 DIAGNOSIS — G8929 Other chronic pain: Secondary | ICD-10-CM

## 2023-09-11 DIAGNOSIS — M722 Plantar fascial fibromatosis: Secondary | ICD-10-CM | POA: Diagnosis not present

## 2023-09-11 NOTE — Patient Instructions (Signed)
 Voltaren gel over areas of pain  Foot HEP  Recommend getting a plantar fasciitis night sock and inserts  Tylenol [phone] mg 2-3 times a day for pain relief  6 week follow up with Dr. Joane or soon with us  if needed

## 2023-09-27 ENCOUNTER — Other Ambulatory Visit: Payer: Self-pay | Admitting: Physician Assistant

## 2023-10-27 ENCOUNTER — Other Ambulatory Visit: Payer: Self-pay | Admitting: Physician Assistant

## 2023-10-30 ENCOUNTER — Ambulatory Visit: Admitting: Family Medicine

## 2023-10-30 ENCOUNTER — Other Ambulatory Visit: Payer: Self-pay

## 2023-10-30 VITALS — BP 138/88 | Ht 65.0 in | Wt 210.0 lb

## 2023-10-30 DIAGNOSIS — M65321 Trigger finger, right index finger: Secondary | ICD-10-CM

## 2023-10-30 DIAGNOSIS — M79641 Pain in right hand: Secondary | ICD-10-CM

## 2023-10-30 DIAGNOSIS — M65342 Trigger finger, left ring finger: Secondary | ICD-10-CM

## 2023-10-30 DIAGNOSIS — M6530 Trigger finger, unspecified finger: Secondary | ICD-10-CM

## 2023-10-30 NOTE — Patient Instructions (Addendum)
 Thank you for coming in today.   You received an injection today. Seek immediate medical attention if the joint becomes red, extremely painful, or is oozing fluid.   If not better, let us  know, and we can refer to a hand surgeon  Check back as needed

## 2023-10-30 NOTE — Progress Notes (Signed)
 LILLETTE Ileana Collet, PhD, LAT, ATC acting as a scribe for Artist Lloyd, MD.  Kaitlin Cortez is a 68 y.o. female who presents to [facility] today for exacerbation of her bilat hand pain. Pt was last seen for her hands on 04/23/23 and was prescribed Ativan  and EMLA  cream and advised to schedule a visit the following wk for injection.  Today, pt reports they have started packing up her house and moving. Pain is located at the R 2nd finger w/ limite finger flexion and on the L hand, pain is in the 4th finger. She also has nodules in the palmar aspect of her hands.   Pertinent review of systems: No fevers or chills  Relevant historical information: History of carpal tunnel surgery and trigger thumb surgery in [place]  years ago. Significant needle phobia   Exam:  BP 138/88   Ht 5' 5 (1.651 m)   Wt 210 lb (95.3 kg)   BMI 34.95 kg/m  General: Well Developed, well nourished, and in no acute distress.   MSK: Right hand significant triggering and lack of range of motion second digit PIP.  Tender palpation palmar MCP.  Left hand triggering and tender palpation left fourth digit.    Lab and Radiology Results  Procedure: Real-time Ultrasound Guided Injection of right second MCP tendon sheath at A1 pulley.  Trigger finger injection Device: Philips Affiniti 50G/GE Logiq Images permanently stored and available for review in PACS Verbal informed consent obtained.  Discussed risks and benefits of procedure. Warned about infection, bleeding, hyperglycemia damage to structures among others. Patient expresses understanding and agreement Time-out conducted.   Noted no overlying erythema, induration, or other signs of local infection.   Skin prepped in a sterile fashion.   Local anesthesia: Topical Ethyl chloride.   With sterile technique and under real time ultrasound guidance: 40 mg of Kenalog  and 1 mL of lidocaine  injected into A1 pulley tendon sheath. Fluid seen  entering the tendon sheath.   Completed without difficulty   Pain immediately resolved suggesting accurate placement of the medication.   Advised to call if fevers/chills, erythema, induration, drainage, or persistent bleeding.   Images permanently stored and available for review in the ultrasound unit.  Impression: Technically successful ultrasound guided injection.  Procedure: Real-time Ultrasound Guided Injection of left fourth MCP tendon sheath at A1 pulley.  Trigger finger injection Device: Philips Affiniti 50G/GE Logiq Images permanently stored and available for review in PACS Verbal informed consent obtained.  Discussed risks and benefits of procedure. Warned about infection, bleeding, hyperglycemia damage to structures among others. Patient expresses understanding and agreement Time-out conducted.   Noted no overlying erythema, induration, or other signs of local infection.   Skin prepped in a sterile fashion.   Local anesthesia: Topical Ethyl chloride.   With sterile technique and under real time ultrasound guidance: 40 mg of Kenalog  and 1 mL of lidocaine  injected into A1 pulley tendon sheath. Fluid seen entering the tendon sheath.   Completed without difficulty   Pain immediately resolved suggesting accurate placement of the medication.   Advised to call if fevers/chills, erythema, induration, drainage, or persistent bleeding.   Images permanently stored and available for review in the ultrasound unit.  Impression: Technically successful ultrasound guided injection.      Assessment and Plan: 68 y.o. female with bilateral trigger finger right second digit left fourth digit for this is a chronic ongoing issue originally evaluated years ago most recently March of this year.  Plan for  injection today of both sides.  She has significant needle phobia so she arrived after taking some lorazepam  and using Emla  cream which did help.   If symptoms recur she would like to proceed to  surgery.  Will happily refer to hand surgery if needed in the future.   PDMP not reviewed this encounter. Orders Placed This Encounter  Procedures   US  LIMITED JOINT SPACE STRUCTURES UP BILAT(NO LINKED CHARGES)    Reason for Exam (SYMPTOM  OR DIAGNOSIS REQUIRED):   bilateral hand pain    Preferred imaging location?:   [facility]   No orders of the defined types were placed in this encounter.    Discussed warning signs or symptoms. Please see discharge instructions. Patient expresses understanding.   The above documentation has been reviewed and is accurate and complete Artist Lloyd, M.D.

## 2023-11-18 ENCOUNTER — Ambulatory Visit: Admitting: Dermatology

## 2023-11-18 ENCOUNTER — Encounter: Payer: Self-pay | Admitting: Dermatology

## 2023-11-18 VITALS — BP 98/66 | HR 63

## 2023-11-18 DIAGNOSIS — W908XXA Exposure to other nonionizing radiation, initial encounter: Secondary | ICD-10-CM

## 2023-11-18 DIAGNOSIS — L719 Rosacea, unspecified: Secondary | ICD-10-CM

## 2023-11-18 DIAGNOSIS — L570 Actinic keratosis: Secondary | ICD-10-CM

## 2023-11-18 NOTE — Patient Instructions (Addendum)
 Date: Wed Nov 18 2023  Hello Kaitlin Cortez,  Thank you for visiting today. Here is a summary of the key instructions:  - Medications:   - Continue taking doxycycline  50 mg daily for rosacea   - Use metronidazole  cream every night   - Apply ivermectin  cream every night  - Skin Care:   - Apply Aquaphor to the treated area for about 2 weeks after freezing   - After the scab falls off, gently massage the new skin for 10 minutes each night  - Other Instructions:   - Wear a band-aid over the treated area while wearing glasses   - Keep the treated area bandaged for about a week  - Follow-up:   - Schedule a follow-up appointment in March 2026   - Full body skin cancer screening will be done at the March appointment  Please reach out if you have any questions or concerns.  Warm regards,  Dr. Delon Lenis [facility]   Cryotherapy Aftercare  Wash gently with soap and water everyday.   Apply Vaseline and Band-Aid daily until healed.  Important Information   Due to recent changes in healthcare laws, you may see results of your pathology and/or laboratory studies on MyChart before the doctors have had a chance to review them. We understand that in some cases there may be results that are confusing or concerning to you. Please understand that not all results are received at the same time and often the doctors may need to interpret multiple results in order to provide you with the best plan of care or course of treatment. Therefore, we ask that you please give us  2 business days to thoroughly review all your results before contacting the office for clarification. Should we see a critical lab result, you will be contacted sooner.     If You Need Anything After Your Visit   If you have any questions or concerns for your doctor, please call our main line at [phone]. If no one answers, please leave a voicemail as directed and we will return your call as soon as possible. Messages left after 4 pm  will be answered the following business day.    You may also send us  a message via MyChart. We typically respond to MyChart messages within 1-2 business days.  For prescription refills, please ask your pharmacy to contact our office. Our fax number is [phone].  If you have an urgent issue when the clinic is closed that cannot wait until the next business day, you can page your doctor at the number below.     Please note that while we do our best to be available for urgent issues outside of office hours, we are not available 24/7.    If you have an urgent issue and are unable to reach us , you may choose to seek medical care at your doctor's office, retail clinic, urgent care center, or emergency room.   If you have a medical emergency, please immediately call 911 or go to the emergency department. In the event of inclement weather, please call our main line at [phone] for an update on the status of any delays or closures.  Dermatology Medication Tips: Please keep the boxes that topical medications come in in order to help keep track of the instructions about where and how to use these. Pharmacies typically print the medication instructions only on the boxes and not directly on the medication tubes.   If your medication is too expensive, please contact our  office at [phone] or send us  a message through MyChart.    We are unable to tell what your co-pay for medications will be in advance as this is different depending on your insurance coverage. However, we may be able to find a substitute medication at lower cost or fill out paperwork to get insurance to cover a needed medication.    If a prior authorization is required to get your medication covered by your insurance company, please allow us  1-2 business days to complete this process.   Drug prices often vary depending on where the prescription is filled and some pharmacies may offer cheaper prices.   The website  www.goodrx.com contains coupons for medications through different pharmacies. The prices here do not account for what the cost may be with help from insurance (it may be cheaper with your insurance), but the website can give you the price if you did not use any insurance.  - You can print the associated coupon and take it with your prescription to the pharmacy.  - You may also stop by our office during regular business hours and pick up a GoodRx coupon card.  - If you need your prescription sent electronically to a different pharmacy, notify our office through [facility] or by phone at [phone]

## 2023-11-18 NOTE — Progress Notes (Signed)
 Follow-Up Visit   Subjective  Kaitlin Cortez is a 68 y.o. female who presents for the following: Rosacea  Patient present today for follow up visit for Rosacea. Patient was last evaluated on 07/27/23. At this visit patient was prescribed Doxycycline  50 mg to take 2 times daily, recommended to continue Metro Cream 2 times daily and start Soolantra  cream 2 times daily. Patient reports she is taking Doxy once at night and she is only is applying Metro cream if her nose is red and she applies Soolantra  nightly. She has been also applying sunscreen daily Patient reports sxs are unchanged. Patient denies medication changes.  Patient reports she has a sore spot on the left side of her nose. She reports she doesn't feel it's because of her glasses. She states some days the soreness is worse than others. She was like to have that closely examined.   The following portions of the chart were reviewed this encounter and updated as appropriate: medications, allergies, medical history  Review of Systems:  No other skin or systemic complaints except as noted in HPI or Assessment and Plan.  Objective  Well appearing patient in no apparent distress; mood and affect are within normal limits.  A focused examination was performed of the following areas: Face  Relevant exam findings are noted in the Assessment and Plan.              Dorsum of Nose Erythematous thin papules/macules with gritty scale.   Assessment & Plan   1. Rosacea with Persistent Erythema - Assessment: Patient reports improvement in rosacea symptoms with current treatment regimen. Redness persists but comes and goes. Previous symptoms of puffiness, aching, and soreness have resolved with doxycycline  treatment. No recent flares reported. General erythema noted on examination. - Plan:    Continue doxycycline  50 mg daily    Double dose for flares    Continue metronidazole  topical nightly    Continue ivermectin  topical nightly     Apply both metronidazole  and ivermectin  at night    Follow-up in March for rosacea management before patient's relocation  2. Actinic Keratosis with Mechanical Irritation - Assessment: Patient presents with a persistent sore spot under glasses. On examination, the area appears to be thick skin from rubbing, possibly early pre-cancer. Decision made to treat as actinic keratosis with cryotherapy. - Plan:    Apply liquid nitrogen cryotherapy to the affected area    Apply Aquaphor to the treated area for 2 weeks    Wear a band-aid while wearing glasses for the first week    After scab falls off and new skin appears, gently massage the area for 10 minutes nightly to promote healing and prevent scarring    Follow-up in March to assess healing and for full-body skin cancer screening   Follow-up in March to assess response to cryotherapy and for comprehensive rosacea management and full-body skin cancer screening. AK (ACTINIC KERATOSIS) Dorsum of Nose Destruction of lesion - Dorsum of Nose  Destruction method: cryotherapy   Informed consent: discussed and consent obtained   Timeout:  patient name, date of birth, surgical site, and procedure verified Lesion destroyed using liquid nitrogen: Yes   Cryotherapy cycles:  1 Outcome: patient tolerated procedure well with no complications     Return in about 5 months (around 04/17/2024) for TBSE.  I, Jetta Ager, am acting as Neurosurgeon for [employer], DO.  Documentation: I have reviewed the above documentation for accuracy and completeness, and I agree with the above.  Delon  Alm, DO

## 2023-11-23 ENCOUNTER — Other Ambulatory Visit: Payer: Self-pay | Admitting: Dermatology

## 2023-11-26 ENCOUNTER — Other Ambulatory Visit: Payer: Self-pay | Admitting: Physician Assistant

## 2023-12-23 ENCOUNTER — Other Ambulatory Visit: Payer: Self-pay | Admitting: Physician Assistant

## 2024-01-19 ENCOUNTER — Other Ambulatory Visit: Payer: Self-pay | Admitting: Physician Assistant

## 2024-02-16 ENCOUNTER — Other Ambulatory Visit: Payer: Self-pay | Admitting: Physician Assistant

## 2024-02-26 ENCOUNTER — Other Ambulatory Visit: Payer: Self-pay | Admitting: Cardiology

## 2024-02-26 DIAGNOSIS — I480 Paroxysmal atrial fibrillation: Secondary | ICD-10-CM

## 2024-02-29 NOTE — Telephone Encounter (Signed)
 In accordance with refill protocols, please review and address the following requirements before this medication refill can be authorized:  Labs

## 2024-03-02 NOTE — Telephone Encounter (Signed)
 Spoke with pt. Pt is currently out of town and will call back to make a appointment. Pt stated she has plenty of flecainide  at this time.

## 2024-03-18 ENCOUNTER — Other Ambulatory Visit: Payer: Self-pay | Admitting: Physician Assistant

## 2024-04-19 ENCOUNTER — Ambulatory Visit: Admitting: Dermatology
